# Patient Record
Sex: Female | Born: 1990 | Race: White | Hispanic: No | Marital: Married | Smoking: Never smoker
Health system: Southern US, Community
[De-identification: ages and names within clinical notes are randomized; demographics above are authoritative.]

## PROBLEM LIST (undated history)

## (undated) ENCOUNTER — Inpatient Hospital Stay (HOSPITAL_COMMUNITY): Admission: RE | Payer: Self-pay | Source: Ambulatory Visit

## (undated) DIAGNOSIS — E559 Vitamin D deficiency, unspecified: Secondary | ICD-10-CM

## (undated) DIAGNOSIS — O1410 Severe pre-eclampsia, unspecified trimester: Secondary | ICD-10-CM

## (undated) DIAGNOSIS — F4310 Post-traumatic stress disorder, unspecified: Secondary | ICD-10-CM

## (undated) DIAGNOSIS — T7840XA Allergy, unspecified, initial encounter: Secondary | ICD-10-CM

## (undated) DIAGNOSIS — O24419 Gestational diabetes mellitus in pregnancy, unspecified control: Secondary | ICD-10-CM

## (undated) DIAGNOSIS — Z3490 Encounter for supervision of normal pregnancy, unspecified, unspecified trimester: Secondary | ICD-10-CM

## (undated) DIAGNOSIS — F419 Anxiety disorder, unspecified: Secondary | ICD-10-CM

## (undated) HISTORY — DX: Anxiety disorder, unspecified: F41.9

## (undated) HISTORY — DX: Allergy, unspecified, initial encounter: T78.40XA

## (undated) HISTORY — DX: Post-traumatic stress disorder, unspecified: F43.10

## (undated) HISTORY — DX: Severe pre-eclampsia, unspecified trimester: O14.10

## (undated) HISTORY — PX: EXTERNAL EAR SURGERY: SHX627

## (undated) HISTORY — DX: Gestational diabetes mellitus in pregnancy, unspecified control: O24.419

## (undated) HISTORY — PX: COSMETIC SURGERY: SHX468

## (undated) HISTORY — DX: Encounter for supervision of normal pregnancy, unspecified, unspecified trimester: Z34.90

## (undated) HISTORY — DX: Vitamin D deficiency, unspecified: E55.9

---
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
## 2010-10-12 ENCOUNTER — Encounter: Payer: Self-pay | Admitting: *Deleted

## 2010-10-12 ENCOUNTER — Emergency Department (HOSPITAL_COMMUNITY): Payer: No Typology Code available for payment source

## 2010-10-12 ENCOUNTER — Emergency Department (HOSPITAL_COMMUNITY)
Admission: EM | Admit: 2010-10-12 | Discharge: 2010-10-12 | Disposition: A | Discharge status: Home / Self Care | Payer: No Typology Code available for payment source | Attending: Emergency Medicine | Admitting: Emergency Medicine

## 2010-10-12 ENCOUNTER — Emergency Department (HOSPITAL_COMMUNITY)
Admission: EM | Admit: 2010-10-12 | Discharge: 2010-10-12 | Discharge status: Against Medical Advice | Payer: Self-pay | Attending: *Deleted | Admitting: *Deleted

## 2010-10-12 DIAGNOSIS — Z5309 Procedure and treatment not carried out because of other contraindication: Secondary | ICD-10-CM | POA: Insufficient documentation

## 2010-10-12 DIAGNOSIS — S20219A Contusion of unspecified front wall of thorax, initial encounter: Secondary | ICD-10-CM

## 2010-10-12 DIAGNOSIS — M549 Dorsalgia, unspecified: Secondary | ICD-10-CM

## 2010-10-12 MED ORDER — HYDROCODONE-ACETAMINOPHEN 5-325 MG PO TABS: 1.0000 | ORAL_TABLET | Freq: Four times a day (QID) | ORAL | Status: AC | PRN

## 2010-10-12 NOTE — ED Notes (Signed)
Patient in xray at this time.
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
## 2010-10-12 NOTE — ED Notes (Signed)
Patient removed from LSB per order of Neville Route, PA.  Patient used bedpan while on LSB.

## 2010-10-12 NOTE — ED Notes (Signed)
NT assisted pt with bedpan on arrival . Urine collected for preg test. NAD at this time.

## 2010-10-12 NOTE — ED Notes (Signed)
Pt denies C-spine tenderness or back pain. Pain to right rib area (front and back). NAD. Pt to go for xray. Urine preg negative.
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
## 2010-10-13 LAB — POCT PREGNANCY, URINE
Preg Test, Ur: NEGATIVE
Preg Test, Ur: NEGATIVE

## 2010-10-17 ENCOUNTER — Emergency Department (HOSPITAL_COMMUNITY)
Admission: EM | Admit: 2010-10-17 | Discharge: 2010-10-17 | Discharge status: Against Medical Advice | Payer: No Typology Code available for payment source | Attending: Emergency Medicine | Admitting: Emergency Medicine

## 2010-10-17 ENCOUNTER — Encounter (HOSPITAL_COMMUNITY): Payer: Self-pay | Admitting: *Deleted

## 2010-10-17 DIAGNOSIS — R079 Chest pain, unspecified: Secondary | ICD-10-CM | POA: Insufficient documentation

## 2010-10-17 DIAGNOSIS — Z5320 Procedure and treatment not carried out because of patient's decision for unspecified reasons: Secondary | ICD-10-CM | POA: Insufficient documentation
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
## 2010-10-18 ENCOUNTER — Encounter (HOSPITAL_COMMUNITY): Payer: Self-pay | Admitting: *Deleted

## 2014-02-01 ENCOUNTER — Emergency Department (HOSPITAL_COMMUNITY)
Admission: EM | Admit: 2014-02-01 | Discharge: 2014-02-01 | Disposition: A | Discharge status: Other Acute Inpatient Hospital | Payer: Managed Care, Other (non HMO) | Attending: Emergency Medicine | Admitting: Emergency Medicine

## 2014-02-01 ENCOUNTER — Encounter (HOSPITAL_COMMUNITY): Payer: Self-pay

## 2014-02-01 DIAGNOSIS — Y9389 Activity, other specified: Secondary | ICD-10-CM | POA: Diagnosis not present

## 2014-02-01 DIAGNOSIS — W540XXA Bitten by dog, initial encounter: Secondary | ICD-10-CM | POA: Diagnosis not present

## 2014-02-01 DIAGNOSIS — Z79899 Other long term (current) drug therapy: Secondary | ICD-10-CM | POA: Insufficient documentation

## 2014-02-01 DIAGNOSIS — S01302A Unspecified open wound of left ear, initial encounter: Secondary | ICD-10-CM | POA: Diagnosis present

## 2014-02-01 DIAGNOSIS — Z23 Encounter for immunization: Secondary | ICD-10-CM | POA: Diagnosis not present

## 2014-02-01 DIAGNOSIS — Y929 Unspecified place or not applicable: Secondary | ICD-10-CM | POA: Insufficient documentation

## 2014-02-01 DIAGNOSIS — S0185XA Open bite of other part of head, initial encounter: Secondary | ICD-10-CM

## 2014-02-01 MED ORDER — SODIUM CHLORIDE 0.9 % IV SOLN
3.0000 g | Freq: Four times a day (QID) | INTRAVENOUS | Status: DC
  Administered 2014-02-01: 3 g via INTRAVENOUS
  Filled 2014-02-01: qty 3

## 2014-02-01 MED ORDER — TETANUS-DIPHTH-ACELL PERTUSSIS 5-2.5-18.5 LF-MCG/0.5 IM SUSP
0.5000 mL | Freq: Once | INTRAMUSCULAR | Status: AC
  Administered 2014-02-01: 1 mL via INTRAMUSCULAR
  Filled 2014-02-01: qty 0.5

## 2014-02-01 MED ORDER — MORPHINE SULFATE 4 MG/ML IJ SOLN
4.0000 mg | Freq: Once | INTRAMUSCULAR | Status: AC
  Administered 2014-02-01: 4 mg via INTRAVENOUS
  Filled 2014-02-01: qty 1

## 2014-02-01 NOTE — ED Notes (Signed)
Animal control in room with patient.
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
## 2014-02-01 NOTE — ED Notes (Signed)
Called [facility] line for consult with a facial surgeon.
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
## 2014-02-01 NOTE — ED Notes (Addendum)
Top of ear placed in bio bag with gauze saturated in saline for moisture. Bag then placed in emisis basin with ice. Saline gauze place on ear for moisture to wound. EDP made aware.

## 2014-02-01 NOTE — ED Notes (Signed)
Verbal consent for transfer given for patient to go by POV to [facility]. Unable to obtain signature via esignature.

## 2014-02-01 NOTE — Discharge Instructions (Signed)
Go directly to [facility]. They will call the plastic surgeon when you get there. Do not eat anything or drink anything until you are seen there.

## 2014-02-01 NOTE — ED Notes (Signed)
EDP in room with patient. Husband in waiting room, patient requesting not to see him at this time.

## 2014-02-10 DIAGNOSIS — S01302A Unspecified open wound of left ear, initial encounter: Secondary | ICD-10-CM | POA: Insufficient documentation

## 2014-02-10 DIAGNOSIS — W540XXA Bitten by dog, initial encounter: Secondary | ICD-10-CM | POA: Insufficient documentation

## 2014-03-17 DIAGNOSIS — H61112 Acquired deformity of pinna, left ear: Secondary | ICD-10-CM | POA: Insufficient documentation

## 2014-08-07 DIAGNOSIS — S01302A Unspecified open wound of left ear, initial encounter: Secondary | ICD-10-CM | POA: Insufficient documentation

## 2014-10-26 ENCOUNTER — Telehealth: Payer: Self-pay | Admitting: Family Medicine

## 2014-10-26 NOTE — Telephone Encounter (Signed)
Appointment scheduled.  Patient aware. 

## 2014-11-09 ENCOUNTER — Other Ambulatory Visit: Payer: Self-pay | Admitting: Adult Health

## 2014-11-18 ENCOUNTER — Telehealth: Payer: Self-pay

## 2014-11-18 ENCOUNTER — Ambulatory Visit (INDEPENDENT_AMBULATORY_CARE_PROVIDER_SITE_OTHER): Payer: Managed Care, Other (non HMO) | Admitting: Family Medicine

## 2014-11-18 ENCOUNTER — Encounter: Payer: Self-pay | Admitting: Family Medicine

## 2014-11-18 VITALS — BP 120/81 | HR 100 | Temp 98.6°F | Ht 62.0 in | Wt 130.6 lb

## 2014-11-18 DIAGNOSIS — Z309 Encounter for contraceptive management, unspecified: Secondary | ICD-10-CM

## 2014-11-18 DIAGNOSIS — F4310 Post-traumatic stress disorder, unspecified: Secondary | ICD-10-CM | POA: Insufficient documentation

## 2014-11-18 DIAGNOSIS — Z3009 Encounter for other general counseling and advice on contraception: Secondary | ICD-10-CM

## 2014-11-18 LAB — POCT URINE PREGNANCY: Preg Test, Ur: NEGATIVE

## 2014-11-18 MED ORDER — MEDROXYPROGESTERONE ACETATE 150 MG/ML IM SUSP: 150.0000 mg | INTRAMUSCULAR | Status: DC

## 2014-11-18 MED ORDER — SERTRALINE HCL 50 MG PO TABS: 50.0000 mg | ORAL_TABLET | Freq: Every day | ORAL | Status: DC

## 2014-11-18 NOTE — Assessment & Plan Note (Addendum)
Patient has been on depo-provera and would like to restart, LMP July 4,2016, last intercourse was 11/17/14
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
## 2014-11-18 NOTE — Patient Instructions (Signed)
Posttraumatic Stress Disorder Posttraumatic stress disorder (PTSD) is a mental disorder. It occurs after a traumatic event in your life. The traumatic events that cause PTSD are outside the range of normal human experience. Examples of these events include war, automobile accidents, natural disasters, rape, domestic violence, and violent crimes. Most people who experience these types of events are able to heal on their own. Those who do not heal develop PTSD. PTSD can happen to anyone at any age. However, people with a history of childhood abuse are at increased risk for developing PTSD.  SYMPTOMS  The traumatic event that causes PTSD must be a threat to life, cause serious injury, or involve sexual violence. The traumatic event is usually experienced directly by the person who develops PTSD. Sometimes PTSD occurs in people who witness traumas that occur to others or who hear about a trauma that occurs to a close family member or friend. The following behaviors are characteristic of people with PTSD:  People with PTSD re-experience the traumatic event in one or more of the following ways (intrusion symptoms):  Recurrent, unwanted distressing memories while awake.  Recurrent distressing dreams.  Sensations similar to those felt when the event originally occurred (flashbacks).   Intense or prolonged emotional distress, triggered by reminders of the trauma. This may include fear, horror, intense sadness, or anger.  Marked physical reactions, triggered by reminders of the trauma. This may include racing heart, shortness of breath, sweating, and shaking.  People with PTSD avoid thoughts, conversations, people, or activities that remind them of the traumatic event (avoidance symptoms).  People with PTSD have negative changes in their thinking and mood after the traumatic event. These changes include:  Inability to remember one or more significant aspects of the traumatic event (memory  gaps).  Exaggerated negative perceptions about themselves or others, such as believing that they are bad people or that no one can be trusted.  Unrealistic assignment of blame to themselves or others for the traumatic event.  Persistent negative emotional state, such as fear, horror, anger, sadness, guilt, or shame.  Markedly decreased interest or participation in significant activities.  A loss of connection with other people.  Inability to experience positive emotions, such as happiness or love.  People with PTSD are more sensitive to their environment and react more easily than others (hyperarousal-overreactivity symptoms). These symptoms include:  Irritability, with angry outbursts toward other people or objects. The outbursts are easily triggered and may be verbal or physical.  Careless or self-destructive behavior. This may include reckless driving or drug use.  A feeling of being on edge, with increased alertness (hypervigilance).  Exaggerated reactions to stimuli, such as being easily startled.   Difficulty concentrating.  Difficulty sleeping. PTSD symptoms may start soon after a frightening event or months or years later. They last at least 1 month or longer and can affect one or more areas of functioning, such as social or occupational functioning.  DIAGNOSIS  PTSD is diagnosed through an assessment by a mental health professional. You will be asked questions about the traumatic events in your life. You will also be asked about how these events have changed your thoughts, mood, behavior, and ability to function on a daily basis. You may be asked about your use of alcohol or drugs, which can make PTSD symptoms worse. TREATMENT  Unlike many mental disorders, which require lifelong management, PTSD is a curable condition. The goal of PTSD treatment is to neutralize the negative effects of the traumatic event on daily   functioning, not erase the memory of the event. The  following treatments may be prescribed to reach this goal:  Medicines. Certain medicines can reduce some PTSD symptoms. Intrusion symptoms and hyperarousal-overactivity symptoms respond best to medicines.  Counseling (talk therapy). Talk therapy with a mental health professional who is experienced in treating PTSD can help. Talk therapy can provide education, emotional support, and coping skills. Certain types of talk therapy that specifically target the traumatic events are the most effective treatment for PTSD:  Prolonged exposure therapy, which involves remembering and processing the traumatic event with a therapist in a safe environment until it no longer creates a negative emotional response.  Eye movement desensitization and reprocessing therapy, which involves the use of repetitive physical stimulation of the senses that alternates between the right and left sides of the body. It is believed that this therapy facilitates communication between the two sides of the brain. This communication helps the mind to integrate the fragmented memories of the traumatic event into a whole story that makes sense and no longer creates a negative emotional response. Most people with PTSD benefit from a combination of these treatments.  Document Released: 12/13/2000 Document Revised: 08/04/2013 Document Reviewed: 06/06/2012 Exit[employer] Patient Information 2015 [employer]. This information is not intended to replace advice given to you by your health care provider. Make sure you discuss any questions you have with your health care provider.  

## 2014-11-18 NOTE — Telephone Encounter (Signed)
Contacted patient, left voicemail to call us back.  Patient is to come back in two weeks for second urine pregnancy test, she just needs to call us the morning that she is planning to come so that we  Can put her on the triage nurses schedule.  Dr. Louanne Skye will order her Depo Provera at that time and patient can pick up medication and come back for administration.

## 2014-11-18 NOTE — Assessment & Plan Note (Signed)
PTSD from dog attack that occurred 3-4 months ago. Discussed the necessity of seeing a counselor and gave a list of local counselors so that she could call make an appointment. We'll also start Zoloft at 50 mg. We'll see back in 4 weeks. Denies suicidal ideation. Discussed risks of medication.

## 2014-11-19 NOTE — Telephone Encounter (Signed)
Patient returned call,patient scheduled appointment with triage nurse for recheck of urine pregnancy and depo provera shot.

## 2014-12-01 ENCOUNTER — Ambulatory Visit: Payer: Self-pay

## 2014-12-02 ENCOUNTER — Ambulatory Visit: Payer: Self-pay

## 2014-12-11 ENCOUNTER — Encounter: Payer: Self-pay | Admitting: Family Medicine

## 2014-12-11 ENCOUNTER — Ambulatory Visit (INDEPENDENT_AMBULATORY_CARE_PROVIDER_SITE_OTHER): Payer: Managed Care, Other (non HMO) | Admitting: Family Medicine

## 2014-12-11 VITALS — BP 102/70 | HR 85 | Temp 98.7°F | Ht 62.0 in | Wt 130.6 lb

## 2014-12-11 DIAGNOSIS — Z30011 Encounter for initial prescription of contraceptive pills: Secondary | ICD-10-CM | POA: Diagnosis not present

## 2014-12-11 DIAGNOSIS — F4310 Post-traumatic stress disorder, unspecified: Secondary | ICD-10-CM | POA: Diagnosis not present

## 2014-12-11 LAB — POCT URINE PREGNANCY: Preg Test, Ur: NEGATIVE

## 2014-12-11 MED ORDER — NORGESTIMATE-ETH ESTRADIOL 0.25-35 MG-MCG PO TABS: 1.0000 | ORAL_TABLET | Freq: Every day | ORAL | Status: DC

## 2014-12-11 NOTE — Patient Instructions (Signed)

## 2014-12-11 NOTE — Assessment & Plan Note (Signed)
Patient tried Zoloft and did not like the way that made her feel such as grogginess and just feeling out of her self. She has been numbers for the counselors but has not called anybody yet, recommended to do that. Talked about other different medications that we could try and she did not want to try any once yet but will come back in a month or sooner if has any further issues.

## 2014-12-11 NOTE — Progress Notes (Signed)
BP 102/70 mmHg  Pulse 85  Temp(Src) 98.7 F (37.1 C) (Oral)  Ht 5\' 2"  (1.575 m)  Wt 130 lb 9.6 oz (59.24 kg)  BMI 23.88 kg/m2  LMP 10/04/2014 (Approximate)   Subjective:    Patient ID: Kristi Anderson, female    DOB: Jul 16, 1990, 24 y.o.   MRN: [id number]  HPI: Kristi Anderson is a 24 y.o. female presenting on 12/11/2014 for Follow up/Discuss Medications   HPI Anxiety/stress disorder Patient presents today for recheck on her anxiety/stress/posttraumatic stress disorder. She was on Zoloft for the past 3 weeks does not like to grogginess in the way that it makes her feel. She also still had an anxiety attack and did not like that it did not help her completely with that. She has not gone to see what the counselors yet but plans to call them for it. She denies any suicidal ideations. She would like to not go on a medication right now and see how she does with the counseling.  Birth control Patient would like to start birth control today, we discussed doing that the shots which she has had previously but she has opted not to do that. She also has had birth control oral pills and would like to do that instead. She's not had a period for 2 months so we'll run a urine pregnancy test.  Relevant past medical, surgical, family and social history reviewed and updated as indicated. Interim medical history since our last visit reviewed. Allergies and medications reviewed and updated.  Review of Systems  Constitutional: Negative for fever and chills.  HENT: Negative for congestion, ear discharge and ear pain.   Eyes: Negative for redness and visual disturbance.  Respiratory: Negative for chest tightness and shortness of breath.   Cardiovascular: Negative for chest pain and leg swelling.  Genitourinary: Positive for menstrual problem (no period for 2 months). Negative for dysuria, urgency, decreased urine volume, vaginal bleeding, vaginal discharge, difficulty urinating, vaginal pain and pelvic  pain.  Musculoskeletal: Negative for back pain and gait problem.  Skin: Negative for rash.  Neurological: Negative for dizziness, light-headedness and headaches.  Psychiatric/Behavioral: Positive for dysphoric mood and agitation. Negative for suicidal ideas, behavioral problems, sleep disturbance and decreased concentration. The patient is nervous/anxious.   All other systems reviewed and are negative.   Per HPI unless specifically indicated above     Medication List       This list is accurate as of: 12/11/14 12:08 PM.  Always use your most recent med list.               norgestimate-ethinyl estradiol 0.25-35 MG-MCG tablet  Commonly known as:  ORTHO-CYCLEN (28)  Take 1 tablet by mouth daily.           Objective:    BP 102/70 mmHg  Pulse 85  Temp(Src) 98.7 F (37.1 C) (Oral)  Ht 5\' 2"  (1.575 m)  Wt 130 lb 9.6 oz (59.24 kg)  BMI 23.88 kg/m2  LMP 10/04/2014 (Approximate)  Wt Readings from Last 3 Encounters:  12/11/14 130 lb 9.6 oz (59.24 kg)  11/18/14 130 lb 9.6 oz (59.24 kg)  02/01/14 115 lb (52.164 kg)    Physical Exam  Constitutional: She is oriented to person, place, and time. She appears well-developed and well-nourished. No distress.  Eyes: Conjunctivae and EOM are normal.  Cardiovascular: Normal rate, regular rhythm, normal heart sounds and intact distal pulses.   No murmur heard. Pulmonary/Chest: Effort normal and breath sounds normal. No respiratory distress. She  has no wheezes.  Musculoskeletal: Normal range of motion. She exhibits no edema or tenderness.  Neurological: She is alert and oriented to person, place, and time. Coordination normal.  Skin: Skin is warm and dry. No rash noted. She is not diaphoretic.  Psychiatric: She has a normal mood and affect. Her behavior is normal.  Vitals reviewed.   Results for orders placed or performed in visit on 11/18/14  POCT urine pregnancy  Result Value Ref Range   Preg Test, Ur Negative Negative        Assessment & Plan:   Problem List Items Addressed This Visit      Other   Post traumatic stress disorder    Patient tried Zoloft and did not like the way that made her feel such as grogginess and just feeling out of her self. She has been numbers for the counselors but has not called anybody yet, recommended to do that. Talked about other different medications that we could try and she did not want to try any once yet but will come back in a month or sooner if has any further issues.       Other Visit Diagnoses    Encounter for initial prescription of contraceptive pills    -  Primary    Patient wants to discuss oral contraceptives instead of Depo shots. Sent Ortho-Cyclen. No period For 2 months, urine pregnancy was negative    Relevant Medications    norgestimate-ethinyl estradiol (ORTHO-CYCLEN, 28,) 0.25-35 MG-MCG tablet    Other Relevant Orders    POCT urine pregnancy (Completed)       She did not want to do her pelvic exam and Pap today but will come back for that in 4 weeks.  Follow up plan: Return in about 4 weeks (around 01/08/2015), or if symptoms worsen or fail to improve, for well woman and PAP.  Arville Care, MD [facility] 12/11/2014, 12:08 PM

## 2015-01-14 ENCOUNTER — Other Ambulatory Visit: Payer: Self-pay | Admitting: Family Medicine

## 2015-01-27 ENCOUNTER — Encounter: Payer: Managed Care, Other (non HMO) | Admitting: Family

## 2015-02-15 ENCOUNTER — Encounter: Payer: Managed Care, Other (non HMO) | Admitting: Family

## 2015-03-10 ENCOUNTER — Encounter: Payer: Self-pay | Admitting: Family Medicine

## 2015-03-10 ENCOUNTER — Ambulatory Visit (INDEPENDENT_AMBULATORY_CARE_PROVIDER_SITE_OTHER): Payer: Managed Care, Other (non HMO) | Admitting: Family Medicine

## 2015-03-10 VITALS — BP 110/78 | HR 101 | Temp 99.4°F | Ht 62.0 in | Wt 132.0 lb

## 2015-03-10 DIAGNOSIS — F32A Depression, unspecified: Secondary | ICD-10-CM

## 2015-03-10 DIAGNOSIS — R0981 Nasal congestion: Secondary | ICD-10-CM | POA: Diagnosis not present

## 2015-03-10 DIAGNOSIS — F418 Other specified anxiety disorders: Secondary | ICD-10-CM

## 2015-03-10 DIAGNOSIS — F419 Anxiety disorder, unspecified: Secondary | ICD-10-CM

## 2015-03-10 DIAGNOSIS — F4310 Post-traumatic stress disorder, unspecified: Secondary | ICD-10-CM

## 2015-03-10 DIAGNOSIS — F329 Major depressive disorder, single episode, unspecified: Secondary | ICD-10-CM

## 2015-03-10 MED ORDER — FLUTICASONE PROPIONATE 50 MCG/ACT NA SUSP: 1.0000 | Freq: Two times a day (BID) | NASAL | Status: DC | PRN

## 2015-03-10 MED ORDER — SERTRALINE HCL 50 MG PO TABS: 50.0000 mg | ORAL_TABLET | Freq: Every day | ORAL | Status: DC

## 2015-03-10 NOTE — Progress Notes (Signed)
BP 110/78 mmHg  Pulse 101  Temp(Src) 99.4 F (37.4 C) (Oral)  Ht 5\' 2"  (1.575 m)  Wt 132 lb (59.875 kg)  BMI 24.14 kg/m2  LMP 02/27/2015 (Exact Date)   Subjective:    Patient ID: Kristi Anderson, female    DOB: [birth year], 24 y.o.   MRN: [id number]  HPI: Kristi Anderson is a 24 y.o. female presenting on 03/10/2015 for Discuss Sertraline   HPI Anxiety and depression and posterior traumatic stress disorder Patient presents for recheck on her anxiety and depression post right stress disorder. She has been taking 25 mg of Zoloft. She lowered herself down to a half pill of the Zoloft because she felt like she was getting some agitation and tremors from the medication. She feels like she is still having some anxiety episodes and some depression. She has been sleeping better at night but feels like the medication is not completely therefore her. She would like to try to go back up to the 50 mg and see if it goes better for her. She denies any suicidal ideations or thoughts of hurting herself.  Nasal congestion and sinus pressure Patient is also been having some nasal congestion and sinus pressure that is been going on for about a week. She denies any fevers or chills. The nasal drainage is clear to white. She has sinus pressure that is worse under her left eye. She denies any sick contacts. She does sometimes get allergies this time year. She has not really been using anything for this yet.  Relevant past medical, surgical, family and social history reviewed and updated as indicated. Interim medical history since our last visit reviewed. Allergies and medications reviewed and updated.  Review of Systems  Constitutional: Negative for fever and chills.  HENT: Positive for congestion, postnasal drip, sinus pressure and sore throat. Negative for ear discharge, ear pain, rhinorrhea and sneezing.   Eyes: Negative for pain, redness and visual disturbance.  Respiratory: Positive for cough.  Negative for chest tightness and shortness of breath.   Cardiovascular: Negative for chest pain and leg swelling.  Genitourinary: Negative for dysuria and difficulty urinating.  Musculoskeletal: Negative for back pain and gait problem.  Skin: Negative for rash.  Neurological: Negative for light-headedness and headaches.  Psychiatric/Behavioral: Positive for dysphoric mood and decreased concentration. Negative for suicidal ideas, behavioral problems, sleep disturbance, self-injury and agitation. The patient is nervous/anxious.   All other systems reviewed and are negative.   Per HPI unless specifically indicated above     Medication List       This list is accurate as of: 03/10/15  1:02 PM.  Always use your most recent med list.               fluticasone 50 MCG/ACT nasal spray  Commonly known as:  FLONASE  Place 1 spray into both nostrils 2 (two) times daily as needed for allergies or rhinitis.     norgestimate-ethinyl estradiol 0.25-35 MG-MCG tablet  Commonly known as:  ORTHO-CYCLEN (28)  Take 1 tablet by mouth daily.     sertraline 50 MG tablet  Commonly known as:  ZOLOFT  Take 1 tablet (50 mg total) by mouth daily.           Objective:    BP 110/78 mmHg  Pulse 101  Temp(Src) 99.4 F (37.4 C) (Oral)  Ht 5\' 2"  (1.575 m)  Wt 132 lb (59.875 kg)  BMI 24.14 kg/m2  LMP 02/27/2015 (Exact Date)  Wt Readings from Last 3  Encounters:  03/10/15 132 lb (59.875 kg)  12/11/14 130 lb 9.6 oz (59.24 kg)  11/18/14 130 lb 9.6 oz (59.24 kg)    Physical Exam  Constitutional: She is oriented to person, place, and time. She appears well-developed and well-nourished. No distress.  HENT:  Right Ear: Tympanic membrane, external ear and ear canal normal.  Left Ear: Tympanic membrane, external ear and ear canal normal.  Nose: Mucosal edema and rhinorrhea present. No epistaxis. Right sinus exhibits no maxillary sinus tenderness and no frontal sinus tenderness. Left sinus exhibits  maxillary sinus tenderness. Left sinus exhibits no frontal sinus tenderness.  Mouth/Throat: Uvula is midline and mucous membranes are normal. Posterior oropharyngeal edema and posterior oropharyngeal erythema present. No oropharyngeal exudate or tonsillar abscesses.  Eyes: Conjunctivae and EOM are normal.  Neck: Neck supple. No thyromegaly present.  Cardiovascular: Normal rate, regular rhythm, normal heart sounds and intact distal pulses.   No murmur heard. Pulmonary/Chest: Effort normal and breath sounds normal. No respiratory distress. She has no wheezes.  Musculoskeletal: Normal range of motion. She exhibits no edema or tenderness.  Lymphadenopathy:    She has no cervical adenopathy.  Neurological: She is alert and oriented to person, place, and time. Coordination normal.  Skin: Skin is warm and dry. No rash noted. She is not diaphoretic.  Psychiatric: Her behavior is normal. Judgment and thought content normal. Her mood appears anxious. Her affect is labile. Her affect is not blunt and not inappropriate. She exhibits a depressed mood. She expresses no suicidal ideation. She expresses no suicidal plans.  Vitals reviewed.   Results for orders placed or performed in visit on 12/11/14  POCT urine pregnancy  Result Value Ref Range   Preg Test, Ur Negative Negative      Assessment & Plan:   Problem List Items Addressed This Visit      Other   Post traumatic stress disorder - Primary   Relevant Medications   sertraline (ZOLOFT) 50 MG tablet    Other Visit Diagnoses    Anxiety and depression        Increased to full tablet of Zoloft, 50 mg    Relevant Medications    sertraline (ZOLOFT) 50 MG tablet    Nasal sinus congestion        Use Flonase and antihistamine    Relevant Medications    fluticasone (FLONASE) 50 MCG/ACT nasal spray        Follow up plan: Return in about 4 weeks (around 04/07/2015), or if symptoms worsen or fail to improve, for Well woman exam/Pap.  Counseling  provided for all of the vaccine components No orders of the defined types were placed in this encounter.    Arville Care, MD [facility] 03/10/2015, 1:02 PM

## 2015-04-04 NOTE — L&D Delivery Note (Signed)
Delivery Note At 4:15 PM a viable female was delivered via Vaginal, Spontaneous Delivery (Presentation: OA;  ).  APGAR: 6, 8; weight pending .   Placenta status: delivered intact with shultz presentation after gentle traction; 3 VC.   Anesthesia:  epidural Episiotomy: None Lacerations: 1st degree periurethral and right labial repaired with 3-0 vicryl Est. Blood Loss (mL): 250  Mom to postpartum.  Baby to Couplet care / Skin to Skin.  Charlesetta GaribaldiKathryn Lorraine Kooistra CNM 01/11/2016, 5:08 PM  OB FELLOW DELIVERY ATTESTATION  I was gloved and present for the delivery in its entirety, and I agree with the above resident's note.    Ernestina PennaNicholas Schenk, MD 6:57 PM

## 2015-04-07 ENCOUNTER — Ambulatory Visit: Payer: Managed Care, Other (non HMO) | Admitting: Family Medicine

## 2015-04-08 ENCOUNTER — Ambulatory Visit: Payer: Managed Care, Other (non HMO) | Admitting: Family Medicine

## 2015-04-28 ENCOUNTER — Other Ambulatory Visit: Payer: Managed Care, Other (non HMO) | Admitting: Family Medicine

## 2015-04-29 ENCOUNTER — Ambulatory Visit (INDEPENDENT_AMBULATORY_CARE_PROVIDER_SITE_OTHER): Payer: 59 | Admitting: Family

## 2015-04-29 ENCOUNTER — Encounter: Payer: Self-pay | Admitting: Family

## 2015-04-29 ENCOUNTER — Other Ambulatory Visit: Payer: Managed Care, Other (non HMO) | Admitting: Family

## 2015-04-29 VITALS — BP 118/74 | HR 96 | Temp 96.8°F | Ht 62.0 in | Wt 132.6 lb

## 2015-04-29 DIAGNOSIS — Z01419 Encounter for gynecological examination (general) (routine) without abnormal findings: Secondary | ICD-10-CM

## 2015-04-29 DIAGNOSIS — Z0000 Encounter for general adult medical examination without abnormal findings: Secondary | ICD-10-CM | POA: Diagnosis not present

## 2015-04-29 DIAGNOSIS — F4310 Post-traumatic stress disorder, unspecified: Secondary | ICD-10-CM | POA: Diagnosis not present

## 2015-04-29 LAB — POCT URINALYSIS DIPSTICK
Bilirubin, UA: NEGATIVE
Glucose, UA: NEGATIVE
Ketones, UA: NEGATIVE
Nitrite, UA: NEGATIVE
Protein, UA: NEGATIVE
Spec Grav, UA: <=1.005
Urobilinogen, UA: NEGATIVE
pH, UA: 5

## 2015-04-29 LAB — POCT UA - MICROSCOPIC ONLY
Casts, Ur, LPF, POC: NEGATIVE
Crystals, Ur, HPF, POC: NEGATIVE
Mucus, UA: NEGATIVE
Yeast, UA: NEGATIVE

## 2015-04-29 NOTE — Patient Instructions (Signed)
Health Maintenance, Female Adopting a healthy lifestyle and getting preventive care can go a long way to promote health and wellness. Talk with your health care provider about what schedule of regular examinations is right for you. This is a good chance for you to check in with your provider about disease prevention and staying healthy. In between checkups, there are plenty of things you can do on your own. Experts have done a lot of research about which lifestyle changes and preventive measures are most likely to keep you healthy. Ask your health care provider for more information. WEIGHT AND DIET  Eat a healthy diet  Be sure to include plenty of vegetables, fruits, low-fat dairy products, and lean protein.  Do not eat a lot of foods high in solid fats, added sugars, or salt.  Get regular exercise. This is one of the most important things you can do for your health.  Most adults should exercise for at least 150 minutes each week. The exercise should increase your heart rate and make you sweat (moderate-intensity exercise).  Most adults should also do strengthening exercises at least twice a week. This is in addition to the moderate-intensity exercise.  Maintain a healthy weight  Body mass index (BMI) is a measurement that can be used to identify possible weight problems. It estimates body fat based on height and weight. Your health care provider can help determine your BMI and help you achieve or maintain a healthy weight.  For females 20 years of age and older:   A BMI below 18.5 is considered underweight.  A BMI of 18.5 to 24.9 is normal.  A BMI of 25 to 29.9 is considered overweight.  A BMI of 30 and above is considered obese.  Watch levels of cholesterol and blood lipids  You should start having your blood tested for lipids and cholesterol at 27 years of age, then have this test every 5 years.  You may need to have your cholesterol levels checked more often if:  Your lipid  or cholesterol levels are high.  You are older than 27 years of age.  You are at high risk for heart disease.  CANCER SCREENING   Lung Cancer  Lung cancer screening is recommended for adults 55-80 years old who are at high risk for lung cancer because of a history of smoking.  A yearly low-dose CT scan of the lungs is recommended for people who:  Currently smoke.  Have quit within the past 15 years.  Have at least a 30-pack-year history of smoking. A pack year is smoking an average of one pack of cigarettes a day for 1 year.  Yearly screening should continue until it has been 15 years since you quit.  Yearly screening should stop if you develop a health problem that would prevent you from having lung cancer treatment.  Breast Cancer  Practice breast self-awareness. This means understanding how your breasts normally appear and feel.  It also means doing regular breast self-exams. Let your health care provider know about any changes, no matter how small.  If you are in your 20s or 30s, you should have a clinical breast exam (CBE) by a health care provider every 1-3 years as part of a regular health exam.  If you are 40 or older, have a CBE every year. Also consider having a breast X-ray (mammogram) every year.  If you have a family history of breast cancer, talk to your health care provider about genetic screening.  If you   are at high risk for breast cancer, talk to your health care provider about having an MRI and a mammogram every year.  Breast cancer gene (BRCA) assessment is recommended for women who have family members with BRCA-related cancers. BRCA-related cancers include:  Breast.  Ovarian.  Tubal.  Peritoneal cancers.  Results of the assessment will determine the need for genetic counseling and BRCA1 and BRCA2 testing. Cervical Cancer Your health care provider may recommend that you be screened regularly for cancer of the pelvic organs (ovaries, uterus, and  vagina). This screening involves a pelvic examination, including checking for microscopic changes to the surface of your cervix (Pap test). You may be encouraged to have this screening done every 3 years, beginning at age 21.  For women ages 30-65, health care providers may recommend pelvic exams and Pap testing every 3 years, or they may recommend the Pap and pelvic exam, combined with testing for human papilloma virus (HPV), every 5 years. Some types of HPV increase your risk of cervical cancer. Testing for HPV may also be done on women of any age with unclear Pap test results.  Other health care providers may not recommend any screening for nonpregnant women who are considered low risk for pelvic cancer and who do not have symptoms. Ask your health care provider if a screening pelvic exam is right for you.  If you have had past treatment for cervical cancer or a condition that could lead to cancer, you need Pap tests and screening for cancer for at least 20 years after your treatment. If Pap tests have been discontinued, your risk factors (such as having a new sexual partner) need to be reassessed to determine if screening should resume. Some women have medical problems that increase the chance of getting cervical cancer. In these cases, your health care provider may recommend more frequent screening and Pap tests. Colorectal Cancer  This type of cancer can be detected and often prevented.  Routine colorectal cancer screening usually begins at 27 years of age and continues through 27 years of age.  Your health care provider may recommend screening at an earlier age if you have risk factors for colon cancer.  Your health care provider may also recommend using home test kits to check for hidden blood in the stool.  A small camera at the end of a tube can be used to examine your colon directly (sigmoidoscopy or colonoscopy). This is done to check for the earliest forms of colorectal  cancer.  Routine screening usually begins at age 50.  Direct examination of the colon should be repeated every 5-10 years through 27 years of age. However, you may need to be screened more often if early forms of precancerous polyps or small growths are found. Skin Cancer  Check your skin from head to toe regularly.  Tell your health care provider about any new moles or changes in moles, especially if there is a change in a mole's shape or color.  Also tell your health care provider if you have a mole that is larger than the size of a pencil eraser.  Always use sunscreen. Apply sunscreen liberally and repeatedly throughout the day.  Protect yourself by wearing long sleeves, pants, a wide-brimmed hat, and sunglasses whenever you are outside. HEART DISEASE, DIABETES, AND HIGH BLOOD PRESSURE   High blood pressure causes heart disease and increases the risk of stroke. High blood pressure is more likely to develop in:  People who have blood pressure in the high end   of the normal range (130-139/85-89 mm Hg).  People who are overweight or obese.  People who are African American.  If you are 38-23 years of age, have your blood pressure checked every 3-5 years. If you are 61 years of age or older, have your blood pressure checked every year. You should have your blood pressure measured twice--once when you are at a hospital or clinic, and once when you are not at a hospital or clinic. Record the average of the two measurements. To check your blood pressure when you are not at a hospital or clinic, you can use:  An automated blood pressure machine at a pharmacy.  A home blood pressure monitor.  If you are between 45 years and 39 years old, ask your health care provider if you should take aspirin to prevent strokes.  Have regular diabetes screenings. This involves taking a blood sample to check your fasting blood sugar level.  If you are at a normal weight and have a low risk for diabetes,  have this test once every three years after 27 years of age.  If you are overweight and have a high risk for diabetes, consider being tested at a younger age or more often. PREVENTING INFECTION  Hepatitis B  If you have a higher risk for hepatitis B, you should be screened for this virus. You are considered at high risk for hepatitis B if:  You were born in a country where hepatitis B is common. Ask your health care provider which countries are considered high risk.  Your parents were born in a high-risk country, and you have not been immunized against hepatitis B (hepatitis B vaccine).  You have HIV or AIDS.  You use needles to inject street drugs.  You live with someone who has hepatitis B.  You have had sex with someone who has hepatitis B.  You get hemodialysis treatment.  You take certain medicines for conditions, including cancer, organ transplantation, and autoimmune conditions. Hepatitis C  Blood testing is recommended for:  Everyone born from [birth year] through 1967.  Anyone with known risk factors for hepatitis C. Sexually transmitted infections (STIs)  You should be screened for sexually transmitted infections (STIs) including gonorrhea and chlamydia if:  You are sexually active and are younger than 27 years of age.  You are older than 27 years of age and your health care provider tells you that you are at risk for this type of infection.  Your sexual activity has changed since you were last screened and you are at an increased risk for chlamydia or gonorrhea. Ask your health care provider if you are at risk.  If you do not have HIV, but are at risk, it may be recommended that you take a prescription medicine daily to prevent HIV infection. This is called pre-exposure prophylaxis (PrEP). You are considered at risk if:  You are sexually active and do not regularly use condoms or know the HIV status of your partner(s).  You take drugs by injection.  You are sexually  active with a partner who has HIV. Talk with your health care provider about whether you are at high risk of being infected with HIV. If you choose to begin PrEP, you should first be tested for HIV. You should then be tested every 3 months for as long as you are taking PrEP.  PREGNANCY   If you are premenopausal and you may become pregnant, ask your health care provider about preconception counseling.  If you may  become pregnant, take 400 to 800 micrograms (mcg) of folic acid every day.  If you want to prevent pregnancy, talk to your health care provider about birth control (contraception). OSTEOPOROSIS AND MENOPAUSE   Osteoporosis is a disease in which the bones lose minerals and strength with aging. This can result in serious bone fractures. Your risk for osteoporosis can be identified using a bone density scan.  If you are 61 years of age or older, or if you are at risk for osteoporosis and fractures, ask your health care provider if you should be screened.  Ask your health care provider whether you should take a calcium or vitamin D supplement to lower your risk for osteoporosis.  Menopause may have certain physical symptoms and risks.  Hormone replacement therapy may reduce some of these symptoms and risks. Talk to your health care provider about whether hormone replacement therapy is right for you.  HOME CARE INSTRUCTIONS   Schedule regular health, dental, and eye exams.  Stay current with your immunizations.   Do not use any tobacco products including cigarettes, chewing tobacco, or electronic cigarettes.  If you are pregnant, do not drink alcohol.  If you are breastfeeding, limit how much and how often you drink alcohol.  Limit alcohol intake to no more than 1 drink per day for nonpregnant women. One drink equals 12 ounces of beer, 5 ounces of wine, or 1 ounces of hard liquor.  Do not use street drugs.  Do not share needles.  Ask your health care provider for help if  you need support or information about quitting drugs.  Tell your health care provider if you often feel depressed.  Tell your health care provider if you have ever been abused or do not feel safe at home.   This information is not intended to replace advice given to you by your health care provider. Make sure you discuss any questions you have with your health care provider.   Document Released: 10/03/2010 Document Revised: 04/10/2014 Document Reviewed: 02/19/2013 Elsevier Interactive Patient Education [employer].

## 2015-04-29 NOTE — Addendum Note (Signed)
Addended by: Prescott Gum on: 04/29/2015 12:50 PM   Modules accepted: Kipp Brood

## 2015-04-29 NOTE — Progress Notes (Signed)
Subjective:    Patient ID: Kristi Anderson, female    DOB: 02-15-1991, 25 y.o.   MRN: [id number]  Pt presents to the office today for CPE with pap.  Gynecologic Exam Pertinent negatives include no chills, headaches or sore throat.  Cough This is a new problem. The current episode started 1 to 4 weeks ago. The problem has been waxing and waning. The problem occurs every few minutes. The cough is non-productive. Associated symptoms include nasal congestion. Pertinent negatives include no chills, ear congestion, ear pain, headaches, myalgias, rhinorrhea, sore throat, shortness of breath or wheezing. The symptoms are aggravated by lying down. She has tried rest and OTC cough suppressant (Flonase) for the symptoms. The treatment provided mild relief. There is no history of asthma or COPD.  Post traumatic stress disorder/ GAD PT states she is taking her Zoloft 50 mg every day. PT states this seems to be helping her anxiety and panic attacks.     Review of Systems  Constitutional: Negative.  Negative for chills.  HENT: Negative.  Negative for ear pain, rhinorrhea and sore throat.   Eyes: Negative.   Respiratory: Positive for cough. Negative for shortness of breath and wheezing.   Cardiovascular: Negative.  Negative for palpitations.  Gastrointestinal: Negative.   Endocrine: Negative.   Genitourinary: Negative.   Musculoskeletal: Negative.  Negative for myalgias.  Neurological: Negative.  Negative for headaches.  Hematological: Negative.   Psychiatric/Behavioral: Negative.   All other systems reviewed and are negative.      Objective:   Physical Exam  Constitutional: She is oriented to person, place, and time. She appears well-developed and well-nourished. No distress.  HENT:  Head: Normocephalic and atraumatic.  Right Ear: External ear normal.  Left Ear: External ear normal.  Nose: Nose normal.  Mouth/Throat: Oropharynx is clear and moist.  Eyes: Pupils are equal, round, and  reactive to light.  Neck: Normal range of motion. Neck supple. No thyromegaly present.  Cardiovascular: Normal rate, regular rhythm, normal heart sounds and intact distal pulses.   No murmur heard. Pulmonary/Chest: Effort normal and breath sounds normal. No respiratory distress. She has no wheezes. Right breast exhibits no inverted nipple, no mass, no nipple discharge, no skin change and no tenderness. Left breast exhibits no inverted nipple, no mass, no nipple discharge, no skin change and no tenderness. Breasts are symmetrical.  Abdominal: Soft. Bowel sounds are normal. She exhibits no distension. There is no tenderness.  Genitourinary: Vagina normal. No vaginal discharge found.  Bimanual exam- no adnexal masses or tenderness, ovaries nonpalpable   Cervix parous and pink- No discharge   Musculoskeletal: Normal range of motion. She exhibits no edema or tenderness.  Neurological: She is alert and oriented to person, place, and time. She has normal reflexes. No cranial nerve deficit.  Skin: Skin is warm and dry.  Psychiatric: She has a normal mood and affect. Her behavior is normal. Judgment and thought content normal.      BP 118/74 mmHg  Pulse 96  Temp(Src) 96.8 F (36 C) (Oral)  Ht _0  (1.575 m)  Wt 132 lb 9.6 oz (60.147 kg)  BMI 24.25 kg/m2  LMP 04/22/2015     Assessment & Plan:  1. Encounter for routine gynecological examination - POCT UA - Microscopic Only - POCT urinalysis dipstick - Pap IG, CT/NG w/ reflex HPV when ASC-U - CMP14+EGFR  2. Post traumatic stress disorder - CMP14+EGFR  3. Annual physical exam - Anemia Profile B - CMP14+EGFR - Lipid panel - Thyroid  Panel With TSH - VITAMIN D 25 Hydroxy (Vit-D Deficiency, Fractures)   Continue all meds Labs pending Health Maintenance reviewed Diet and exercise encouraged RTO 1 year and keep follow up appts with Dr. Warrick Parisian   Evelina Dun, FNP

## 2015-04-30 LAB — VITAMIN D 25 HYDROXY (VIT D DEFICIENCY, FRACTURES): Vit D, 25-Hydroxy: 14.9 ng/mL — ABNORMAL LOW (ref 30.0–100.0)

## 2015-04-30 LAB — ANEMIA PROFILE B
Basophils Absolute: 0.1 10*3/uL (ref 0.0–0.2)
Basos: 1 %
EOS (ABSOLUTE): 0.1 10*3/uL (ref 0.0–0.4)
Eos: 1 %
Ferritin: 46 ng/mL (ref 15–150)
Folate: 10.6 ng/mL (ref >3.0)
Hematocrit: 42.3 % (ref 34.0–46.6)
Hemoglobin: 14.3 g/dL (ref 11.1–15.9)
Immature Grans (Abs): 0 10*3/uL (ref 0.0–0.1)
Immature Granulocytes: 0 %
Iron Saturation: 34 % (ref 15–55)
Iron: 98 ug/dL (ref 27–159)
Lymphocytes Absolute: 2.1 10*3/uL (ref 0.7–3.1)
Lymphs: 36 %
MCH: 30.3 pg (ref 26.6–33.0)
MCHC: 33.8 g/dL (ref 31.5–35.7)
MCV: 90 fL (ref 79–97)
Monocytes Absolute: 0.5 10*3/uL (ref 0.1–0.9)
Monocytes: 8 %
Neutrophils Absolute: 3.1 10*3/uL (ref 1.4–7.0)
Neutrophils: 54 %
Platelets: 294 10*3/uL (ref 150–379)
RBC: 4.72 x10E6/uL (ref 3.77–5.28)
RDW: 12.7 % (ref 12.3–15.4)
Retic Ct Pct: 0.8 % (ref 0.6–2.6)
Total Iron Binding Capacity: 292 ug/dL (ref 250–450)
UIBC: 194 ug/dL (ref 131–425)
Vitamin B-12: 381 pg/mL (ref 211–946)
WBC: 5.7 10*3/uL (ref 3.4–10.8)

## 2015-04-30 LAB — LIPID PANEL
Chol/HDL Ratio: 3.5 ratio units (ref 0.0–4.4)
Cholesterol, Total: 173 mg/dL (ref 100–199)
HDL: 50 mg/dL (ref >39)
LDL Calculated: 109 mg/dL — ABNORMAL HIGH (ref 0–99)
Triglycerides: 70 mg/dL (ref 0–149)
VLDL Cholesterol Cal: 14 mg/dL (ref 5–40)

## 2015-04-30 LAB — CMP14+EGFR
ALT: 7 IU/L (ref 0–32)
AST: 17 IU/L (ref 0–40)
Albumin/Globulin Ratio: 1.7 (ref 1.1–2.5)
Albumin: 4.3 g/dL (ref 3.5–5.5)
Alkaline Phosphatase: 84 IU/L (ref 39–117)
BUN/Creatinine Ratio: 9 (ref 8–20)
BUN: 6 mg/dL (ref 6–20)
Bilirubin Total: 0.3 mg/dL (ref 0.0–1.2)
CO2: 23 mmol/L (ref 18–29)
Calcium: 9.2 mg/dL (ref 8.7–10.2)
Chloride: 102 mmol/L (ref 96–106)
Creatinine, Ser: 0.69 mg/dL (ref 0.57–1.00)
GFR calc Af Amer: 138 mL/min/{1.73_m2} (ref >59)
GFR calc non Af Amer: 120 mL/min/{1.73_m2} (ref >59)
Globulin, Total: 2.5 g/dL (ref 1.5–4.5)
Glucose: 87 mg/dL (ref 65–99)
Potassium: 4.2 mmol/L (ref 3.5–5.2)
Sodium: 139 mmol/L (ref 134–144)
Total Protein: 6.8 g/dL (ref 6.0–8.5)

## 2015-04-30 LAB — THYROID PANEL WITH TSH
Free Thyroxine Index: 2.7 (ref 1.2–4.9)
T3 Uptake Ratio: 31 % (ref 24–39)
T4, Total: 8.7 ug/dL (ref 4.5–12.0)
TSH: 1.62 u[IU]/mL (ref 0.450–4.500)

## 2015-05-02 LAB — PAP IG, CT-NG, RFX HPV ASCU
Chlamydia, Nuc. Acid Amp: NEGATIVE
Gonococcus by Nucleic Acid Amp: NEGATIVE
PAP Smear Comment: 0

## 2015-05-03 ENCOUNTER — Other Ambulatory Visit: Payer: Self-pay | Admitting: Family

## 2015-05-03 DIAGNOSIS — E559 Vitamin D deficiency, unspecified: Secondary | ICD-10-CM | POA: Insufficient documentation

## 2015-05-03 MED ORDER — VITAMIN D (ERGOCALCIFEROL) 1.25 MG (50000 UNIT) PO CAPS: 50000.0000 [IU] | ORAL_CAPSULE | ORAL | Status: DC

## 2015-06-01 ENCOUNTER — Ambulatory Visit (INDEPENDENT_AMBULATORY_CARE_PROVIDER_SITE_OTHER): Payer: 59 | Admitting: Adult Health

## 2015-06-01 ENCOUNTER — Encounter: Payer: Self-pay | Admitting: Adult Health

## 2015-06-01 VITALS — BP 110/70 | HR 88 | Ht 62.0 in | Wt 129.0 lb

## 2015-06-01 DIAGNOSIS — Z3490 Encounter for supervision of normal pregnancy, unspecified, unspecified trimester: Secondary | ICD-10-CM

## 2015-06-01 DIAGNOSIS — Z3201 Encounter for pregnancy test, result positive: Secondary | ICD-10-CM | POA: Diagnosis not present

## 2015-06-01 DIAGNOSIS — N925 Other specified irregular menstruation: Secondary | ICD-10-CM

## 2015-06-01 DIAGNOSIS — O3680X0 Pregnancy with inconclusive fetal viability, not applicable or unspecified: Secondary | ICD-10-CM

## 2015-06-01 HISTORY — DX: Encounter for supervision of normal pregnancy, unspecified, unspecified trimester: Z34.90

## 2015-06-01 LAB — POCT URINE PREGNANCY: Preg Test, Ur: POSITIVE — AB

## 2015-06-01 MED ORDER — PRENATAL PLUS 27-1 MG PO TABS: 1.0000 | ORAL_TABLET | Freq: Every day | ORAL | Status: DC

## 2015-06-01 NOTE — Progress Notes (Signed)
Subjective:     Patient ID: Kristi Anderson, female   DOB: 06-Aug-1990, 25 y.o.   MRN: [id number]  HPI Kristi Anderson is a 25 year old white female, married in for a UPT, she has missed a period and had 2 HPTs.She has had some cramps, but no bleeding.She was attacked by dog in 2015 and has had 4 left ear surgeries.Has PTSD. Her husband does not know yet and has said he didn't want kids.She also has vitamin D def, and is supposed to take vitamin D 50,000 IU per week, has not started yet. PCP is [facility], and she had pap there in January, 2017.  Review of Systems Patient denies any headaches, hearing loss, fatigue, blurred vision, shortness of breath, chest pain, abdominal pain, problems with bowel movements, urination, or intercourse. No joint pain or mood swings.See HPI for positives. Reviewed past medical,surgical, social and family history. Reviewed medications and allergies.     Objective:   Physical Exam BP 110/70 mmHg  Pulse 88  Ht  (1.575 m)  Wt 129 lb (58.514 kg)  BMI 23.59 kg/m2  LMP 04/23/2015 UPT +, about 5+4 weeks by LMP with EDD 5+4 weeks, medicaid form given, Skin warm and dry. Neck: mid line trachea, normal thyroid, good ROM, no lymphadenopathy noted. Lungs: clear to ausculation bilaterally. Cardiovascular: regular rate and rhythm.Abdomen soft and non tender.   Pt made aware of answering service and to go to [facility] ER or [facility]U at [facility] if needed.  Assessment:     UPT+ Pregnant     Plan:     Rx prenatal Plus #30 take 1 daily with 11 refills Return in 2 weeks for dating [place] Review hand out on first trimester

## 2015-06-01 NOTE — Patient Instructions (Signed)
First Trimester of Pregnancy The first trimester of pregnancy is from week 1 until the end of week 12 (months 1 through 3). A week after a sperm fertilizes an egg, the egg will implant on the wall of the uterus. This embryo will begin to develop into a baby. Genes from you and your partner are forming the baby. The female genes determine whether the baby is a boy or a girl. At 6-8 weeks, the eyes and face are formed, and the heartbeat can be seen on ultrasound. At the end of 12 weeks, all the baby's organs are formed.  Now that you are pregnant, you will want to do everything you can to have a healthy baby. Two of the most important things are to get good prenatal care and to follow your health care provider's instructions. Prenatal care is all the medical care you receive before the baby's birth. This care will help prevent, find, and treat any problems during the pregnancy and childbirth. BODY CHANGES Your body goes through many changes during pregnancy. The changes vary from woman to woman.   You may gain or lose a couple of pounds at first.  You may feel sick to your stomach (nauseous) and throw up (vomit). If the vomiting is uncontrollable, call your health care provider.  You may tire easily.  You may develop headaches that can be relieved by medicines approved by your health care provider.  You may urinate more often. Painful urination may mean you have a bladder infection.  You may develop heartburn as a result of your pregnancy.  You may develop constipation because certain hormones are causing the muscles that push waste through your intestines to slow down.  You may develop hemorrhoids or swollen, bulging veins (varicose veins).  Your breasts may begin to grow larger and become tender. Your nipples may stick out more, and the tissue that surrounds them (areola) may become darker.  Your gums may bleed and may be sensitive to brushing and flossing.  Dark spots or blotches (chloasma,  mask of pregnancy) may develop on your face. This will likely fade after the baby is born.  Your menstrual periods will stop.  You may have a loss of appetite.  You may develop cravings for certain kinds of food.  You may have changes in your emotions from day to day, such as being excited to be pregnant or being concerned that something may go wrong with the pregnancy and baby.  You may have more vivid and strange dreams.  You may have changes in your hair. These can include thickening of your hair, rapid growth, and changes in texture. Some women also have hair loss during or after pregnancy, or hair that feels dry or thin. Your hair will most likely return to normal after your baby is born. WHAT TO EXPECT AT YOUR PRENATAL VISITS During a routine prenatal visit:  You will be weighed to make sure you and the baby are growing normally.  Your blood pressure will be taken.  Your abdomen will be measured to track your baby's growth.  The fetal heartbeat will be listened to starting around week 10 or 12 of your pregnancy.  Test results from any previous visits will be discussed. Your health care provider may ask you:  How you are feeling.  If you are feeling the baby move.  If you have had any abnormal symptoms, such as leaking fluid, bleeding, severe headaches, or abdominal cramping.  If you are using any tobacco products,   including cigarettes, chewing tobacco, and electronic cigarettes.  If you have any questions. Other tests that may be performed during your first trimester include:  Blood tests to find your blood type and to check for the presence of any previous infections. They will also be used to check for low iron levels (anemia) and Rh antibodies. Later in the pregnancy, blood tests for diabetes will be done along with other tests if problems develop.  Urine tests to check for infections, diabetes, or protein in the urine.  An ultrasound to confirm the proper growth  and development of the baby.  An amniocentesis to check for possible genetic problems.  Fetal screens for spina bifida and Down syndrome.  You may need other tests to make sure you and the baby are doing well.  HIV (human immunodeficiency virus) testing. Routine prenatal testing includes screening for HIV, unless you choose not to have this test. HOME CARE INSTRUCTIONS  Medicines  Follow your health care provider's instructions regarding medicine use. Specific medicines may be either safe or unsafe to take during pregnancy.  Take your prenatal vitamins as directed.  If you develop constipation, try taking a stool softener if your health care provider approves. Diet  Eat regular, well-balanced meals. Choose a variety of foods, such as meat or vegetable-based protein, fish, milk and low-fat dairy products, vegetables, fruits, and whole grain breads and cereals. Your health care provider will help you determine the amount of weight gain that is right for you.  Avoid raw meat and uncooked cheese. These carry germs that can cause birth defects in the baby.  Eating four or five small meals rather than three large meals a day may help relieve nausea and vomiting. If you start to feel nauseous, eating a few soda crackers can be helpful. Drinking liquids between meals instead of during meals also seems to help nausea and vomiting.  If you develop constipation, eat more high-fiber foods, such as fresh vegetables or fruit and whole grains. Drink enough fluids to keep your urine clear or pale yellow. Activity and Exercise  Exercise only as directed by your health care provider. Exercising will help you:  Control your weight.  Stay in shape.  Be prepared for labor and delivery.  Experiencing pain or cramping in the lower abdomen or low back is a good sign that you should stop exercising. Check with your health care provider before continuing normal exercises.  Try to avoid standing for long  periods of time. Move your legs often if you must stand in one place for a long time.  Avoid heavy lifting.  Wear low-heeled shoes, and practice good posture.  You may continue to have sex unless your health care provider directs you otherwise. Relief of Pain or Discomfort  Wear a good support bra for breast tenderness.   Take warm sitz baths to soothe any pain or discomfort caused by hemorrhoids. Use hemorrhoid cream if your health care provider approves.   Rest with your legs elevated if you have leg cramps or low back pain.  If you develop varicose veins in your legs, wear support hose. Elevate your feet for 15 minutes, 3-4 times a day. Limit salt in your diet. Prenatal Care  Schedule your prenatal visits by the twelfth week of pregnancy. They are usually scheduled monthly at first, then more often in the last 2 months before delivery.  Write down your questions. Take them to your prenatal visits.  Keep all your prenatal visits as directed by your   health care provider. Safety  Wear your seat belt at all times when driving.  Make a list of emergency phone numbers, including numbers for family, friends, the hospital, and police and fire departments. General Tips  Ask your health care provider for a referral to a local prenatal education class. Begin classes no later than at the beginning of month 6 of your pregnancy.  Ask for help if you have counseling or nutritional needs during pregnancy. Your health care provider can offer advice or refer you to specialists for help with various needs.  Do not use hot tubs, steam rooms, or saunas.  Do not douche or use tampons or scented sanitary pads.  Do not cross your legs for long periods of time.  Avoid cat litter boxes and soil used by cats. These carry germs that can cause birth defects in the baby and possibly loss of the fetus by miscarriage or stillbirth.  Avoid all smoking, herbs, alcohol, and medicines not prescribed by  your health care provider. Chemicals in these affect the formation and growth of the baby.  Do not use any tobacco products, including cigarettes, chewing tobacco, and electronic cigarettes. If you need help quitting, ask your health care provider. You may receive counseling support and other resources to help you quit.  Schedule a dentist appointment. At home, brush your teeth with a soft toothbrush and be gentle when you floss. SEEK MEDICAL CARE IF:   You have dizziness.  You have mild pelvic cramps, pelvic pressure, or nagging pain in the abdominal area.  You have persistent nausea, vomiting, or diarrhea.  You have a bad smelling vaginal discharge.  You have pain with urination.  You notice increased swelling in your face, hands, legs, or ankles. SEEK IMMEDIATE MEDICAL CARE IF:   You have a fever.  You are leaking fluid from your vagina.  You have spotting or bleeding from your vagina.  You have severe abdominal cramping or pain.  You have rapid weight gain or loss.  You vomit blood or material that looks like coffee grounds.  You are exposed to German measles and have never had them.  You are exposed to fifth disease or chickenpox.  You develop a severe headache.  You have shortness of breath.  You have any kind of trauma, such as from a fall or a car accident.   This information is not intended to replace advice given to you by your health care provider. Make sure you discuss any questions you have with your health care provider.   Document Released: 03/14/2001 Document Revised: 04/10/2014 Document Reviewed: 01/28/2013 Elsevier Interactive Patient Education 2016 [employer]. Return in 2 weeks for dating [place] 

## 2015-06-14 ENCOUNTER — Telehealth: Payer: Self-pay | Admitting: Women's Health

## 2015-06-14 NOTE — Telephone Encounter (Signed)
Left message I called 

## 2015-06-14 NOTE — Telephone Encounter (Signed)
Pt c/o nausea and vomiting early pregnancy, requesting meds.

## 2015-06-15 ENCOUNTER — Other Ambulatory Visit: Payer: 59

## 2015-06-21 ENCOUNTER — Telehealth: Payer: Self-pay | Admitting: Obstetrics & Gynecology

## 2015-06-21 NOTE — Telephone Encounter (Signed)
Pt called stating that she would like to speak with a nurse. Pt did not give her a reason why. Please contact pt

## 2015-06-21 NOTE — Telephone Encounter (Signed)
Pt requesting refill on Zofran.

## 2015-06-21 NOTE — Telephone Encounter (Signed)
Call in am for appt, is still having nausea, but is not coming here for pregnancy,

## 2015-07-13 ENCOUNTER — Encounter: Payer: 59 | Admitting: Certified Nurse Midwife

## 2015-07-13 ENCOUNTER — Encounter: Payer: Self-pay | Admitting: Obstetrics

## 2015-07-13 ENCOUNTER — Ambulatory Visit (INDEPENDENT_AMBULATORY_CARE_PROVIDER_SITE_OTHER): Payer: 59 | Admitting: Obstetrics

## 2015-07-13 VITALS — BP 130/84 | HR 116 | Wt 115.0 lb

## 2015-07-13 DIAGNOSIS — O219 Vomiting of pregnancy, unspecified: Secondary | ICD-10-CM

## 2015-07-13 DIAGNOSIS — Z3401 Encounter for supervision of normal first pregnancy, first trimester: Secondary | ICD-10-CM

## 2015-07-13 DIAGNOSIS — F4310 Post-traumatic stress disorder, unspecified: Secondary | ICD-10-CM

## 2015-07-13 DIAGNOSIS — Z3687 Encounter for antenatal screening for uncertain dates: Secondary | ICD-10-CM

## 2015-07-13 DIAGNOSIS — K219 Gastro-esophageal reflux disease without esophagitis: Secondary | ICD-10-CM

## 2015-07-13 LAB — POCT URINALYSIS DIPSTICK
Bilirubin, UA: NEGATIVE
Glucose, UA: NEGATIVE
Nitrite, UA: NEGATIVE
Spec Grav, UA: 1.02
Urobilinogen, UA: NEGATIVE
pH, UA: 6

## 2015-07-13 MED ORDER — DOXYLAMINE SUCCINATE (SLEEP) 25 MG PO TABS: 25.0000 mg | ORAL_TABLET | Freq: Every evening | ORAL | Status: DC | PRN

## 2015-07-13 MED ORDER — METOCLOPRAMIDE HCL 10 MG PO TABS: 10.0000 mg | ORAL_TABLET | Freq: Three times a day (TID) | ORAL | Status: DC

## 2015-07-13 MED ORDER — VITAMIN B-6 50 MG PO TABS: 50.0000 mg | ORAL_TABLET | Freq: Two times a day (BID) | ORAL | Status: DC

## 2015-07-13 MED ORDER — RANITIDINE HCL 150 MG PO TABS: 150.0000 mg | ORAL_TABLET | Freq: Two times a day (BID) | ORAL | Status: DC

## 2015-07-13 NOTE — Progress Notes (Signed)
Subjective:    Kristi Anderson is being seen today for her first obstetrical visit.  This is not a planned pregnancy. She is at [redacted]w[redacted]d gestation. Her obstetrical history is significant for none. Relationship with FOB: significant other, not living together. Patient does intend to breast feed. Pregnancy history fully reviewed.  The information documented in the HPI was reviewed and verified.  Menstrual History: OB History    Gravida Para Term Preterm AB TAB SAB Ectopic Multiple Living         Patient's last menstrual period was 04/23/2015 (approximate).    Past Medical History  Diagnosis Date  . Allergy     morphine and latex  . Anxiety   . Pregnant 06/01/2015  . Vitamin D deficiency   . PTSD (post-traumatic stress disorder)     had dog attack 11/15 had 4 surgeries on left ear  . PTSD (post-traumatic stress disorder)     Past Surgical History  Procedure Laterality Date  . Cosmetic surgery      reconstructive surgery after attack by dog  . External ear surgery       (Not in a hospital admission) Allergies  Allergen Reactions  . Morphine And Related Nausea Only  . Latex Swelling    Social History  Substance Use Topics  . Smoking status: Passive Smoke Exposure - Never Smoker    Types: Cigarettes  . Smokeless tobacco: Never Used  . Alcohol Use: No    Family History  Problem Relation Age of Onset  . Arthritis Mother   . Heart disease Mother   . Clotting disorder Father   . Seizures Sister   . Stroke Maternal Grandmother   . Heart disease Maternal Grandfather   . Early death Maternal Grandfather   . Arthritis Paternal Grandmother   . Diabetes Paternal Grandfather   . Heart failure Father   . Heart failure Father   . Heart failure Father   . Heart failure Father      Review of Systems Constitutional: negative for weight loss Gastrointestinal: negative for vomiting Genitourinary:negative for genital lesions and vaginal discharge and  dysuria Musculoskeletal:negative for back pain Behavioral/Psych: negative for abusive relationship, depression, illegal drug usage and tobacco use    Objective:    BP 130/84 mmHg  Pulse 116  Wt 115 lb (52.164 kg)  LMP 04/23/2015 (Approximate) General Appearance:    Alert, cooperative, no distress, appears stated age  Head:    Normocephalic, without obvious abnormality, atraumatic  Eyes:    PERRL, conjunctiva/corneas clear, EOM's intact, fundi    benign, both eyes  Ears:    Normal TM's and external ear canals, both ears  Nose:   Nares normal, septum midline, mucosa normal, no drainage    or sinus tenderness  Throat:   Lips, mucosa, and tongue normal; teeth and gums normal  Neck:   Supple, symmetrical, trachea midline, no adenopathy;    thyroid:  no enlargement/tenderness/nodules; no carotid   bruit or JVD  Back:     Symmetric, no curvature, ROM normal, no CVA tenderness  Lungs:     Clear to auscultation bilaterally, respirations unlabored  Chest Wall:    No tenderness or deformity   Heart:    Regular rate and rhythm, S1 and S2 normal, no murmur, rub   or gallop  Breast Exam:    No tenderness, masses, or nipple abnormality  Abdomen:     Soft, non-tender, bowel sounds active all four  quadrants,    no masses, no organomegaly  Genitalia:    Normal female without lesion, discharge or tenderness  Extremities:   Extremities normal, atraumatic, no cyanosis or edema  Pulses:   2+ and symmetric all extremities  Skin:   Skin color, texture, turgor normal, no rashes or lesions  Lymph nodes:   Cervical, supraclavicular, and axillary nodes normal  Neurologic:   CNII-XII intact, normal strength, sensation and reflexes    throughout      Lab Review Urine pregnancy test Labs reviewed yes Radiologic studies reviewed no Assessment:    Pregnancy at 2458w4d weeks    Plan:      Prenatal vitamins.  Counseling provided regarding continued use of seat belts, cessation of alcohol consumption,  smoking or use of illicit drugs; infection precautions i.e., influenza/TDAP immunizations, toxoplasmosis,CMV, parvovirus, listeria and varicella; workplace safety, exercise during pregnancy; routine dental care, safe medications, sexual activity, hot tubs, saunas, pools, travel, caffeine use, fish and methlymercury, potential toxins, hair treatments, varicose veins Weight gain recommendations per IOM guidelines reviewed: underweight/BMI< 18.5--> gain 28 - 40 lbs; normal weight/BMI 18.5 - 24.9--> gain 25 - 35 lbs; overweight/BMI 25 - 29.9--> gain 15 - 25 lbs; obese/BMI >30->gain  11 - 20 lbs Problem list reviewed and updated. FIRST/CF mutation testing/NIPT/QUAD SCREEN/fragile X/Ashkenazi Jewish population testing/Spinal muscular atrophy discussed: requested. Role of ultrasound in pregnancy discussed; fetal survey: requested. Amniocentesis discussed: not indicated. VBAC calculator score: VBAC consent form provided  Meds ordered this encounter  Medications  . metoCLOPramide (REGLAN) 10 MG tablet    Sig: Take 1 tablet (10 mg total) by mouth 3 (three) times daily before meals.    Dispense:  90 tablet    Refill:  5  . ranitidine (ZANTAC) 150 MG tablet    Sig: Take 1 tablet (150 mg total) by mouth 2 (two) times daily.    Dispense:  60 tablet    Refill:  5  . pyridOXINE (VITAMIN B-6) 50 MG tablet    Sig: Take 1 tablet (50 mg total) by mouth 2 (two) times daily at 8 am and 10 pm.    Dispense:  60 tablet    Refill:  5  . doxylamine, Sleep, (UNISOM) 25 MG tablet    Sig: Take 1 tablet (25 mg total) by mouth at bedtime as needed.    Dispense:  30 tablet    Refill:  5   Orders Placed This Encounter  Procedures  . Culture, OB Urine  . US OB Comp Less 14 Wks    Standing Status: Future     Number of Occurrences:      Standing Expiration Date: 09/11/2016    Order Specific Question:  Reason for Exam (SYMPTOM  OR DIAGNOSIS REQUIRED)    Answer:  unsure lmp    Order Specific Question:  Preferred  imaging location?    Answer:  Internal  . US OB Transvaginal    Standing Status: Future     Number of Occurrences:      Standing Expiration Date: 09/11/2016    Order Specific Question:  Reason for Exam (SYMPTOM  OR DIAGNOSIS REQUIRED)    Answer:  unsure lmp    Order Specific Question:  Preferred imaging location?    Answer:  Internal  . NuSwab Vaginitis Plus (VG+)  . HIV antibody  . Hemoglobinopathy evaluation  . Varicella zoster antibody, IgG  . VITAMIN D 25 Hydroxy (Vit-D Deficiency, Fractures)  . Prenatal Profile I  . POCT urinalysis dipstick  Follow up in 4 weeks.

## 2015-07-15 ENCOUNTER — Ambulatory Visit (INDEPENDENT_AMBULATORY_CARE_PROVIDER_SITE_OTHER): Payer: 59

## 2015-07-15 ENCOUNTER — Telehealth: Payer: Self-pay | Admitting: *Deleted

## 2015-07-15 ENCOUNTER — Encounter: Payer: Self-pay | Admitting: Obstetrics

## 2015-07-15 DIAGNOSIS — O3680X1 Pregnancy with inconclusive fetal viability, fetus 1: Secondary | ICD-10-CM | POA: Diagnosis not present

## 2015-07-15 DIAGNOSIS — Z3687 Encounter for antenatal screening for uncertain dates: Secondary | ICD-10-CM

## 2015-07-15 LAB — PRENATAL PROFILE I(LABCORP)
Antibody Screen: NEGATIVE
Basophils Absolute: 0 10*3/uL (ref 0.0–0.2)
Basos: 0 %
EOS (ABSOLUTE): 0 10*3/uL (ref 0.0–0.4)
Eos: 0 %
Hematocrit: 43.8 % (ref 34.0–46.6)
Hemoglobin: 15 g/dL (ref 11.1–15.9)
Hepatitis B Surface Ag: NEGATIVE
Immature Grans (Abs): 0 10*3/uL (ref 0.0–0.1)
Immature Granulocytes: 0 %
Lymphocytes Absolute: 1.9 10*3/uL (ref 0.7–3.1)
Lymphs: 21 %
MCH: 30.2 pg (ref 26.6–33.0)
MCHC: 34.2 g/dL (ref 31.5–35.7)
MCV: 88 fL (ref 79–97)
Monocytes Absolute: 0.6 10*3/uL (ref 0.1–0.9)
Monocytes: 7 %
Neutrophils Absolute: 6.5 10*3/uL (ref 1.4–7.0)
Neutrophils: 72 %
Platelets: 239 10*3/uL (ref 150–379)
RBC: 4.97 x10E6/uL (ref 3.77–5.28)
RDW: 13.4 % (ref 12.3–15.4)
RPR Ser Ql: NONREACTIVE
Rh Factor: NEGATIVE
Rubella Antibodies, IGG: 4.64 index (ref >0.99)
WBC: 9.1 10*3/uL (ref 3.4–10.8)

## 2015-07-15 LAB — URINE CULTURE, OB REFLEX

## 2015-07-15 LAB — VARICELLA ZOSTER ANTIBODY, IGG: Varicella zoster IgG: 570 index (ref >165)

## 2015-07-15 LAB — HEMOGLOBINOPATHY EVALUATION
HGB C: 0 %
HGB S: 0 %
Hemoglobin A2 Quantitation: 2.4 % (ref 0.7–3.1)
Hemoglobin F Quantitation: 0 % (ref 0.0–2.0)
Hgb A: 97.6 % (ref 94.0–98.0)

## 2015-07-15 LAB — HIV ANTIBODY (ROUTINE TESTING W REFLEX): HIV Screen 4th Generation wRfx: NONREACTIVE

## 2015-07-15 LAB — CULTURE, OB URINE

## 2015-07-15 LAB — VITAMIN D 25 HYDROXY (VIT D DEFICIENCY, FRACTURES): Vit D, 25-Hydroxy: 18.8 ng/mL — ABNORMAL LOW (ref 30.0–100.0)

## 2015-07-15 NOTE — Telephone Encounter (Signed)
Patient requested call back 4:06 LM on VM to CB

## 2015-07-15 NOTE — Telephone Encounter (Signed)
Spoke with pt regarding Rx for PNV. Pt was made aware that Dr Clearance CootsHarper would like for her to use PNV that she was given earlier this pregnancy. Pt states that she is currently having sever N&V.  Pt advised to not take PNV at this time until she can get N&V under control with Rx given at visit.  Pt advised to start PNV once she feels that she can tolerate them and not having any vomiting episodes.

## 2015-07-16 ENCOUNTER — Other Ambulatory Visit: Payer: Self-pay | Admitting: Obstetrics

## 2015-07-16 DIAGNOSIS — B9689 Other specified bacterial agents as the cause of diseases classified elsewhere: Secondary | ICD-10-CM

## 2015-07-16 DIAGNOSIS — N760 Acute vaginitis: Principal | ICD-10-CM

## 2015-07-16 LAB — NUSWAB VAGINITIS PLUS (VG+)
Atopobium vaginae: HIGH Score — AB
BVAB 2: HIGH Score — AB
Candida albicans, NAA: NEGATIVE
Candida glabrata, NAA: NEGATIVE
Chlamydia trachomatis, NAA: NEGATIVE
Megasphaera 1: HIGH Score — AB
Neisseria gonorrhoeae, NAA: NEGATIVE
Trich vag by NAA: NEGATIVE

## 2015-07-16 MED ORDER — METRONIDAZOLE 500 MG PO TABS: 500.0000 mg | ORAL_TABLET | Freq: Two times a day (BID) | ORAL | Status: DC

## 2015-07-27 ENCOUNTER — Encounter: Payer: Self-pay | Admitting: *Deleted

## 2015-07-27 ENCOUNTER — Ambulatory Visit (INDEPENDENT_AMBULATORY_CARE_PROVIDER_SITE_OTHER): Payer: 59 | Admitting: Obstetrics

## 2015-07-27 ENCOUNTER — Encounter: Payer: Self-pay | Admitting: Obstetrics

## 2015-07-27 VITALS — BP 119/84 | HR 87 | Wt 122.0 lb

## 2015-07-27 DIAGNOSIS — Z3492 Encounter for supervision of normal pregnancy, unspecified, second trimester: Secondary | ICD-10-CM

## 2015-07-27 LAB — POCT URINALYSIS DIPSTICK
Bilirubin, UA: NEGATIVE
Glucose, UA: NEGATIVE
Ketones, UA: NEGATIVE
Leukocytes, UA: NEGATIVE
Nitrite, UA: NEGATIVE
Protein, UA: NEGATIVE
Spec Grav, UA: 1.015
Urobilinogen, UA: NEGATIVE
pH, UA: 5

## 2015-07-27 NOTE — Progress Notes (Signed)
  Subjective:    Kristi Anderson is a 25 y.o. female being seen today for her obstetrical visit. She is at 1327w4d gestation. Patient reports: no complaints.  Problem List Items Addressed This Visit    None    Visit Diagnoses    Prenatal care, second trimester    -  Primary    Relevant Orders    POCT urinalysis dipstick (Completed)      Patient Active Problem List   Diagnosis Date Noted  . Pregnant 06/01/2015  . Vitamin D deficiency 05/03/2015  . Post traumatic stress disorder 11/18/2014  . Counseling for initiation of birth control method 11/18/2014    Objective:     BP 119/84 mmHg  Pulse 87  Wt 122 lb (55.339 kg)  LMP 04/23/2015 (Approximate) Uterine Size: Below umbilicus     Assessment:    Pregnancy @ 8427w4d  weeks Doing well    Plan:    Problem list reviewed and updated. Labs reviewed.  Follow up in 4 weeks. FIRST/CF mutation testing/NIPT/QUAD SCREEN/fragile X/Ashkenazi Jewish population testing/Spinal muscular atrophy discussed: requested. Role of ultrasound in pregnancy discussed; fetal survey: requested. Amniocentesis discussed: not indicated.

## 2015-07-27 NOTE — Progress Notes (Signed)
Patient is having cramping. Her supervisor is having her lift heavy tv's, mattress, box springs. Asking if maybe she can a letter stating no heavy lifting.

## 2015-08-02 ENCOUNTER — Other Ambulatory Visit: Payer: Self-pay | Admitting: *Deleted

## 2015-08-02 DIAGNOSIS — Z3490 Encounter for supervision of normal pregnancy, unspecified, unspecified trimester: Secondary | ICD-10-CM

## 2015-08-02 MED ORDER — PRENATE PIXIE 10-0.6-0.4-200 MG PO CAPS: 1.0000 | ORAL_CAPSULE | Freq: Every day | ORAL | Status: DC

## 2015-08-10 ENCOUNTER — Telehealth: Payer: Self-pay | Admitting: *Deleted

## 2015-08-10 ENCOUNTER — Ambulatory Visit (INDEPENDENT_AMBULATORY_CARE_PROVIDER_SITE_OTHER): Payer: 59 | Admitting: Obstetrics

## 2015-08-10 ENCOUNTER — Encounter: Payer: Self-pay | Admitting: Obstetrics

## 2015-08-10 VITALS — BP 114/81 | HR 91 | Wt 121.0 lb

## 2015-08-10 DIAGNOSIS — Z3492 Encounter for supervision of normal pregnancy, unspecified, second trimester: Secondary | ICD-10-CM

## 2015-08-10 LAB — POCT URINALYSIS DIPSTICK
Bilirubin, UA: NEGATIVE
Glucose, UA: NEGATIVE
Ketones, UA: NEGATIVE
Leukocytes, UA: NEGATIVE
Nitrite, UA: NEGATIVE
Spec Grav, UA: 1.025
Urobilinogen, UA: NEGATIVE
pH, UA: 5

## 2015-08-10 NOTE — Telephone Encounter (Signed)
Attempt to contact pt regarding quad screen.  Was not drawn at today's visit. LM on VM making pt aware that we could schedule her for lab appt if she would like to have lab work. Advised to call office if she would like to schedule.

## 2015-08-10 NOTE — Progress Notes (Signed)
  Subjective:    Pricilla Rifflelizabeth Massett is a 25 y.o. female being seen today for her obstetrical visit. She is at 6247w4d gestation. Patient reports: no complaints.  Problem List Items Addressed This Visit    None    Visit Diagnoses    Prenatal care in second trimester    -  Primary    Relevant Orders    POCT Urinalysis Dipstick (Completed)    US OB Comp + 14 Wk      Patient Active Problem List   Diagnosis Date Noted  . Pregnant 06/01/2015  . Vitamin D deficiency 05/03/2015  . Post traumatic stress disorder 11/18/2014  . Counseling for initiation of birth control method 11/18/2014    Objective:     BP 114/81 mmHg  Pulse 91  Wt 121 lb (54.885 kg)  LMP 04/23/2015 (Approximate) Uterine Size: Below umbilicus     Assessment:    Pregnancy @ 647w4d  weeks Doing well    Plan:    Problem list reviewed and updated. Labs reviewed.  Follow up in 4 weeks. FIRST/CF mutation testing/NIPT/QUAD SCREEN/fragile X/Ashkenazi Jewish population testing/Spinal muscular atrophy discussed: requested. Role of ultrasound in pregnancy discussed; fetal survey: requested. Amniocentesis discussed: not indicated.

## 2015-08-18 ENCOUNTER — Other Ambulatory Visit: Payer: Self-pay

## 2015-08-23 ENCOUNTER — Other Ambulatory Visit: Payer: Self-pay

## 2015-08-31 ENCOUNTER — Encounter: Payer: Self-pay | Admitting: Obstetrics

## 2015-08-31 ENCOUNTER — Ambulatory Visit (INDEPENDENT_AMBULATORY_CARE_PROVIDER_SITE_OTHER): Payer: 59

## 2015-08-31 ENCOUNTER — Ambulatory Visit (INDEPENDENT_AMBULATORY_CARE_PROVIDER_SITE_OTHER): Payer: 59 | Admitting: Obstetrics

## 2015-08-31 ENCOUNTER — Other Ambulatory Visit: Payer: 59

## 2015-08-31 VITALS — BP 102/71 | HR 84 | Wt 125.0 lb

## 2015-08-31 DIAGNOSIS — Z36 Encounter for antenatal screening of mother: Secondary | ICD-10-CM | POA: Diagnosis not present

## 2015-08-31 DIAGNOSIS — Z3402 Encounter for supervision of normal first pregnancy, second trimester: Secondary | ICD-10-CM

## 2015-08-31 DIAGNOSIS — Z3492 Encounter for supervision of normal pregnancy, unspecified, second trimester: Secondary | ICD-10-CM

## 2015-08-31 LAB — POCT URINALYSIS DIPSTICK
Bilirubin, UA: NEGATIVE
Blood, UA: NEGATIVE
Glucose, UA: NEGATIVE
Ketones, UA: NEGATIVE
Leukocytes, UA: NEGATIVE
Nitrite, UA: NEGATIVE
Protein, UA: NEGATIVE
Spec Grav, UA: <=1.005
Urobilinogen, UA: NEGATIVE
pH, UA: 6

## 2015-08-31 NOTE — Progress Notes (Signed)
  Subjective:    Kristi Anderson is a 25 y.o. female being seen today for her obstetrical visit. She is at 6670w4d gestation. Patient reports: no complaints.  Problem List Items Addressed This Visit    None    Visit Diagnoses    Encounter for supervision of normal first pregnancy in second trimester    -  Primary    Relevant Orders    AFP, Quad Screen    POCT urinalysis dipstick (Completed)      Patient Active Problem List   Diagnosis Date Noted  . Pregnant 06/01/2015  . Vitamin D deficiency 05/03/2015  . Post traumatic stress disorder 11/18/2014  . Counseling for initiation of birth control method 11/18/2014    Objective:     BP 102/71 mmHg  Pulse 84  Wt 125 lb (56.7 kg)  LMP 04/23/2015 (Approximate) Uterine Size: Below umbilicus     Assessment:    Pregnancy @ 6970w4d  weeks Doing well    Plan:    Problem list reviewed and updated. Labs reviewed.  Follow up in 4 weeks. FIRST/CF mutation testing/NIPT/QUAD SCREEN/fragile X/Ashkenazi Jewish population testing/Spinal muscular atrophy discussed: requested. Role of ultrasound in pregnancy discussed; fetal survey: requested. Amniocentesis discussed: not indicated.

## 2015-09-02 LAB — AFP, QUAD SCREEN
DIA Mom Value: 1.84
DIA Value (EIA): 370.07 pg/mL
DSR (By Age)    1 IN: 878
DSR (Second Trimester) 1 IN: 5616
Gestational Age: 18.6 WEEKS
MSAFP Mom: 1.27
MSAFP: 66.5 ng/mL
MSHCG Mom: 2.73
MSHCG: 82432 m[IU]/mL
Maternal Age At EDD: 27.7 YEARS
Osb Risk: 5251
T18 (By Age): 1:3420 {titer}
Test Results:: NEGATIVE
Weight: 125 [lb_av]
uE3 Mom: 1.47
uE3 Value: 2.35 ng/mL

## 2015-09-28 ENCOUNTER — Encounter: Payer: Self-pay | Admitting: Obstetrics

## 2015-09-28 ENCOUNTER — Ambulatory Visit (INDEPENDENT_AMBULATORY_CARE_PROVIDER_SITE_OTHER): Payer: 59 | Admitting: Obstetrics

## 2015-09-28 VITALS — BP 112/72 | HR 89 | Wt 131.0 lb

## 2015-09-28 DIAGNOSIS — Z3402 Encounter for supervision of normal first pregnancy, second trimester: Secondary | ICD-10-CM

## 2015-09-28 LAB — POCT URINALYSIS DIPSTICK
Spec Grav, UA: 1.02
pH, UA: 7

## 2015-09-28 NOTE — Progress Notes (Signed)
Subjective:    Kristi Anderson is a 25 y.o. female being seen today for her obstetrical visit. She is at 262w4d gestation. Patient reports: no complaints . Fetal movement: normal.  Problem List Items Addressed This Visit    None    Visit Diagnoses    Encounter for supervision of normal first pregnancy in second trimester    -  Primary    Relevant Orders    POCT urinalysis dipstick (Completed)      Patient Active Problem List   Diagnosis Date Noted  . Pregnant 06/01/2015  . Vitamin D deficiency 05/03/2015  . Post traumatic stress disorder 11/18/2014  . Counseling for initiation of birth control method 11/18/2014   Objective:    BP 112/72 mmHg  Pulse 89  Wt 131 lb (59.421 kg)  LMP 04/23/2015 (Approximate) FHT: 150 BPM  Uterine Size: size equals dates     Assessment:    Pregnancy @ 7562w4d    Plan:    Signs and symptoms of preterm labor: discussed.  Labs, problem list reviewed and updated 2 hr GTT planned Follow up in 4 weeks.

## 2015-10-26 ENCOUNTER — Ambulatory Visit (INDEPENDENT_AMBULATORY_CARE_PROVIDER_SITE_OTHER): Payer: 59 | Admitting: Certified Nurse Midwife

## 2015-10-26 VITALS — BP 110/75 | HR 87 | Temp 97.6°F | Wt 136.4 lb

## 2015-10-26 DIAGNOSIS — Z3402 Encounter for supervision of normal first pregnancy, second trimester: Secondary | ICD-10-CM

## 2015-10-26 LAB — POCT URINALYSIS DIPSTICK
Bilirubin, UA: NEGATIVE
Blood, UA: NEGATIVE
Glucose, UA: NEGATIVE
Ketones, UA: NEGATIVE
Leukocytes, UA: NEGATIVE
Nitrite, UA: NEGATIVE
Protein, UA: NEGATIVE
Spec Grav, UA: 1.01
Urobilinogen, UA: NEGATIVE
pH, UA: 6

## 2015-10-26 NOTE — Progress Notes (Signed)
Subjective:    Kristi Anderson is a 25 y.o. female being seen today for her obstetrical visit. She is at [redacted]w[redacted]d gestation. Patient reports: fatigue, no bleeding, no contractions, no leaking and occasional cramping . Fetal movement: normal.  Problem List Items Addressed This Visit    None    Visit Diagnoses    Encounter for supervision of normal first pregnancy in second trimester    -  Primary   Relevant Orders   POCT urinalysis dipstick (Completed)     Patient Active Problem List   Diagnosis Date Noted  . Pregnant 06/01/2015  . Vitamin D deficiency 05/03/2015  . Post traumatic stress disorder 11/18/2014  . Counseling for initiation of birth control method 11/18/2014   Objective:    BP 110/75   Pulse 87   Temp 97.6 F (36.4 C)   Wt 136 lb 6.4 oz (61.9 kg)   LMP 04/23/2015 (Approximate)   BMI 24.95 kg/m  FHT: 148 BPM  Uterine Size: 25CM, S<D.     Assessment:    Pregnancy @ [redacted]w[redacted]d    Plan:    OBGCT: ordered for next visit. Signs and symptoms of preterm labor: discussed.  Labs, problem list reviewed and updated 2 hr GTT planned Follow up in 2 weeks.

## 2015-10-26 NOTE — Patient Instructions (Addendum)
Before [facility] Before your baby arrives it is important to:  Have all of the supplies that you will need to care for your baby.  Know where to go if there is an emergency.  Discuss the baby's arrival with other family members. WHAT SUPPLIES WILL I NEED? It is recommended that you have the following supplies: Large Items  Crib.  Crib mattress.  Rear-facing infant car seat. If possible, have a trained professional check to make sure that it is installed correctly. Feeding  6-8 bottles that are 4-5 oz in size.  6-8 nipples.  Bottle brush.  Sterilizer, or a large pan or kettle with a lid.  A way to boil and cool water.  If you will be breastfeeding:  Breast pump.  Nipple cream.  Nursing bra.  Breast pads.  Breast shields.  If you will be formula feeding:  Formula.  Measuring cups.  Measuring spoons. Bathing  Mild baby soap and baby shampoo.  Petroleum jelly.  Soft cloth towel and washcloth.  Hooded towel.  Cotton balls.  Bath basin. Other Supplies  Rectal thermometer.  Bulb syringe.  Baby wipes or washcloths for diaper changes.  Diaper bag.  Changing pad.  Clothing, including one-piece outfits and pajamas.  Baby nail clippers.  Receiving blankets.  Mattress pad and sheets for the crib.  Night-light for the baby's room.  Baby monitor.  2 or 3 pacifiers.  Either 24-36 cloth diapers and waterproof diaper covers or a box of disposable diapers. You may need to use as many as 10-12 diapers per day. HOW DO I PREPARE FOR AN EMERGENCY? Prepare for an emergency by:  Knowing how to get to the nearest hospital.  Listing the phone numbers of your baby's health care providers near your home phone and in your cell phone. HOW DO I PREPARE MY FAMILY?  Decide how to handle visitors.  If you have other children:  Talk with them about the baby coming home. Ask them how they feel about it.  Read a book together about being a new big  brother or sister.  Find ways to let them help you prepare for the new baby.  Have someone ready to care for them while you are in the hospital.   This information is not intended to replace advice given to you by your health care provider. Make sure you discuss any questions you have with your health care provider.   Document Released: 03/02/2008 Document Revised: 08/04/2014 Document Reviewed: 02/25/2014 Elsevier Interactive Patient Education 2016 [employer]. Dental Work and Pregnancy Proper dental care before, during, and after pregnancy is important for you and your baby. Pregnancy hormones can sometimes cause the gums to swell, which makes it easier for food to become trapped between teeth. The health of your teeth and gums can affect your growing baby.  DENTAL CARE RECOMMENDATIONS To help prevent infection and maintain healthy teeth and gums, a thorough oral examination is recommended for all women in the first trimester of pregnancy. Routine cleanings and examinations are recommended throughout pregnancy.  DENTAL CARE CONSIDERATIONS  Tell your dentist if you are pregnant or want to become pregnant.   If you are pregnant, routine X-ray exams should be avoided until after your baby is born. However, they do not need to be avoided if you are trying to become pregnant.   If you need an emergency procedure that includes a dental X-ray exam during pregnancy, very low levels of radiation will be emitted from the X-ray machines.  Lead aprons can be used for protection of the chest, abdomen, and thyroid.  Your dentist will help you weigh the risks and benefits of dental procedures during pregnancy. If possible, it is best to have dental procedures (such as cavity fillings and crown repair) during the second trimester of pregnancy or after the baby is born.   If you and your dentist decide to postpone a procedure for any reason, your dentist can suggest treatment to reduce the chance of  infection until the procedure is performed.  HOME CARE INSTRUCTIONS   Follow good oral hygiene habits at home.  Brush your teeth twice daily with fluoride toothpaste. Brush thoroughly for at least 2 minutes. Avoid strong flavored toothpastes if you have morning sickness.   Floss at least once daily.   Eat a well-balanced diet low in sugar and carbohydrates.   See your dentist for normal interval oral examinations and cleanings.   If you vomit, rinse your mouth with water afterward.   Make a dental appointment if you experience oral problems.   Follow up with your dentist as directed.  SEEK DENTAL CARE IF:  Oral symptoms such as pain, bleeding, swelling, or inflammation develop or worsen.   You develop growths or swelling in between teeth.   You develop signs of infection, such as:   A fever of more than 100.5 F (38.1 C).   Chills.   This information is not intended to replace advice given to you by your health care provider. Make sure you discuss any questions you have with your health care provider.   Document Released: 09/07/2009 Document Revised: 01/08/2013 Document Reviewed: 08/21/2012 Elsevier Interactive Patient Education [employer] Third Trimester of Pregnancy The third trimester is from week 29 through week 42, months 7 through 9. The third trimester is a time when the fetus is growing rapidly. At the end of the ninth month, the fetus is about 20 inches in length and weighs 6-10 pounds.  BODY CHANGES Your body goes through many changes during pregnancy. The changes vary from woman to woman.   Your weight will continue to increase. You can expect to gain 25-35 pounds (11-16 kg) by the end of the pregnancy.  You may begin to get stretch marks on your hips, abdomen, and breasts.  You may urinate more often because the fetus is moving lower into your pelvis and pressing on your bladder.  You may develop or continue to have heartburn as a  result of your pregnancy.  You may develop constipation because certain hormones are causing the muscles that push waste through your intestines to slow down.  You may develop hemorrhoids or swollen, bulging veins (varicose veins).  You may have pelvic pain because of the weight gain and pregnancy hormones relaxing your joints between the bones in your pelvis. Backaches may result from overexertion of the muscles supporting your posture.  You may have changes in your hair. These can include thickening of your hair, rapid growth, and changes in texture. Some women also have hair loss during or after pregnancy, or hair that feels dry or thin. Your hair will most likely return to normal after your baby is born.  Your breasts will continue to grow and be tender. A yellow discharge may leak from your breasts called colostrum.  Your belly button may stick out.  You may feel short of breath because of your expanding uterus.  You may notice the fetus "dropping," or moving lower in your abdomen.  You may  have a bloody mucus discharge. This usually occurs a few days to a week before labor begins.  Your cervix becomes thin and soft (effaced) near your due date. WHAT TO EXPECT AT YOUR PRENATAL EXAMS  You will have prenatal exams every 2 weeks until week 36. Then, you will have weekly prenatal exams. During a routine prenatal visit:  You will be weighed to make sure you and the fetus are growing normally.  Your blood pressure is taken.  Your abdomen will be measured to track your baby's growth.  The fetal heartbeat will be listened to.  Any test results from the previous visit will be discussed.  You may have a cervical check near your due date to see if you have effaced. At around 36 weeks, your caregiver will check your cervix. At the same time, your caregiver will also perform a test on the secretions of the vaginal tissue. This test is to determine if a type of bacteria, Group B  streptococcus, is present. Your caregiver will explain this further. Your caregiver may ask you:  What your birth plan is.  How you are feeling.  If you are feeling the baby move.  If you have had any abnormal symptoms, such as leaking fluid, bleeding, severe headaches, or abdominal cramping.  If you are using any tobacco products, including cigarettes, chewing tobacco, and electronic cigarettes.  If you have any questions. Other tests or screenings that may be performed during your third trimester include:  Blood tests that check for low iron levels (anemia).  Fetal testing to check the health, activity level, and growth of the fetus. Testing is done if you have certain medical conditions or if there are problems during the pregnancy.  HIV (human immunodeficiency virus) testing. If you are at high risk, you may be screened for HIV during your third trimester of pregnancy. FALSE LABOR You may feel small, irregular contractions that eventually go away. These are called Braxton Hicks contractions, or false labor. Contractions may last for hours, days, or even weeks before true labor sets in. If contractions come at regular intervals, intensify, or become painful, it is best to be seen by your caregiver.  SIGNS OF LABOR   Menstrual-like cramps.  Contractions that are 5 minutes apart or less.  Contractions that start on the top of the uterus and spread down to the lower abdomen and back.  A sense of increased pelvic pressure or back pain.  A watery or bloody mucus discharge that comes from the vagina. If you have any of these signs before the 37th week of pregnancy, call your caregiver right away. You need to go to the hospital to get checked immediately. HOME CARE INSTRUCTIONS   Avoid all smoking, herbs, alcohol, and unprescribed drugs. These chemicals affect the formation and growth of the baby.  Do not use any tobacco products, including cigarettes, chewing tobacco, and  electronic cigarettes. If you need help quitting, ask your health care provider. You may receive counseling support and other resources to help you quit.  Follow your caregiver's instructions regarding medicine use. There are medicines that are either safe or unsafe to take during pregnancy.  Exercise only as directed by your caregiver. Experiencing uterine cramps is a good sign to stop exercising.  Continue to eat regular, healthy meals.  Wear a good support bra for breast tenderness.  Do not use hot tubs, steam rooms, or saunas.  Wear your seat belt at all times when driving.  Avoid raw meat, uncooked  cheese, cat litter boxes, and soil used by cats. These carry germs that can cause birth defects in the baby.  Take your prenatal vitamins.  Take 1500-2000 mg of calcium daily starting at the 20th week of pregnancy until you deliver your baby.  Try taking a stool softener (if your caregiver approves) if you develop constipation. Eat more high-fiber foods, such as fresh vegetables or fruit and whole grains. Drink plenty of fluids to keep your urine clear or pale yellow.  Take warm sitz baths to soothe any pain or discomfort caused by hemorrhoids. Use hemorrhoid cream if your caregiver approves.  If you develop varicose veins, wear support hose. Elevate your feet for 15 minutes, 3-4 times a day. Limit salt in your diet.  Avoid heavy lifting, wear low heal shoes, and practice good posture.  Rest a lot with your legs elevated if you have leg cramps or low back pain.  Visit your dentist if you have not gone during your pregnancy. Use a soft toothbrush to brush your teeth and be gentle when you floss.  A sexual relationship may be continued unless your caregiver directs you otherwise.  Do not travel far distances unless it is absolutely necessary and only with the approval of your caregiver.  Take prenatal classes to understand, practice, and ask questions about the labor and  delivery.  Make a trial run to the hospital.  Pack your hospital bag.  Prepare the baby's nursery.  Continue to go to all your prenatal visits as directed by your caregiver. SEEK MEDICAL CARE IF:  You are unsure if you are in labor or if your water has broken.  You have dizziness.  You have mild pelvic cramps, pelvic pressure, or nagging pain in your abdominal area.  You have persistent nausea, vomiting, or diarrhea.  You have a bad smelling vaginal discharge.  You have pain with urination. SEEK IMMEDIATE MEDICAL CARE IF:   You have a fever.  You are leaking fluid from your vagina.  You have spotting or bleeding from your vagina.  You have severe abdominal cramping or pain.  You have rapid weight loss or gain.  You have shortness of breath with chest pain.  You notice sudden or extreme swelling of your face, hands, ankles, feet, or legs.  You have not felt your baby move in over an hour.  You have severe headaches that do not go away with medicine.  You have vision changes.   This information is not intended to replace advice given to you by your health care provider. Make sure you discuss any questions you have with your health care provider.   Document Released: 03/14/2001 Document Revised: 04/10/2014 Document Reviewed: 05/21/2012 Elsevier Interactive Patient Education [employer]

## 2015-11-01 ENCOUNTER — Other Ambulatory Visit: Payer: 59

## 2015-11-01 DIAGNOSIS — Z3403 Encounter for supervision of normal first pregnancy, third trimester: Secondary | ICD-10-CM

## 2015-11-02 LAB — RPR: RPR Ser Ql: NONREACTIVE

## 2015-11-02 LAB — CBC
Hematocrit: 41.5 % (ref 34.0–46.6)
Hemoglobin: 13.9 g/dL (ref 11.1–15.9)
MCH: 32 pg (ref 26.6–33.0)
MCHC: 33.5 g/dL (ref 31.5–35.7)
MCV: 95 fL (ref 79–97)
Platelets: 225 10*3/uL (ref 150–379)
RBC: 4.35 x10E6/uL (ref 3.77–5.28)
RDW: 13.2 % (ref 12.3–15.4)
WBC: 12 10*3/uL — ABNORMAL HIGH (ref 3.4–10.8)

## 2015-11-02 LAB — GLUCOSE TOLERANCE, 2 HOURS W/ 1HR
Glucose, 1 hour: 159 mg/dL (ref 65–179)
Glucose, 2 hour: 117 mg/dL (ref 65–152)
Glucose, Fasting: 78 mg/dL (ref 65–91)

## 2015-11-02 LAB — HIV ANTIBODY (ROUTINE TESTING W REFLEX): HIV Screen 4th Generation wRfx: NONREACTIVE

## 2015-11-08 ENCOUNTER — Inpatient Hospital Stay (HOSPITAL_COMMUNITY)
Admission: AD | Admit: 2015-11-08 | Discharge: 2015-11-08 | Disposition: A | Discharge status: Home / Self Care | Payer: Medicaid Other | Source: Ambulatory Visit | Attending: Obstetrics & Gynecology | Admitting: Obstetrics & Gynecology

## 2015-11-08 ENCOUNTER — Encounter (HOSPITAL_COMMUNITY): Payer: Self-pay | Admitting: *Deleted

## 2015-11-08 DIAGNOSIS — O99343 Other mental disorders complicating pregnancy, third trimester: Secondary | ICD-10-CM | POA: Insufficient documentation

## 2015-11-08 DIAGNOSIS — Z885 Allergy status to narcotic agent status: Secondary | ICD-10-CM | POA: Insufficient documentation

## 2015-11-08 DIAGNOSIS — Z7722 Contact with and (suspected) exposure to environmental tobacco smoke (acute) (chronic): Secondary | ICD-10-CM | POA: Diagnosis not present

## 2015-11-08 DIAGNOSIS — E559 Vitamin D deficiency, unspecified: Secondary | ICD-10-CM | POA: Diagnosis not present

## 2015-11-08 DIAGNOSIS — Z91040 Latex allergy status: Secondary | ICD-10-CM | POA: Diagnosis not present

## 2015-11-08 DIAGNOSIS — Z3A29 29 weeks gestation of pregnancy: Secondary | ICD-10-CM | POA: Diagnosis not present

## 2015-11-08 DIAGNOSIS — N898 Other specified noninflammatory disorders of vagina: Secondary | ICD-10-CM | POA: Diagnosis not present

## 2015-11-08 DIAGNOSIS — F4310 Post-traumatic stress disorder, unspecified: Secondary | ICD-10-CM | POA: Diagnosis not present

## 2015-11-08 DIAGNOSIS — O26893 Other specified pregnancy related conditions, third trimester: Secondary | ICD-10-CM | POA: Diagnosis not present

## 2015-11-08 LAB — URINALYSIS, ROUTINE W REFLEX MICROSCOPIC
Bilirubin Urine: NEGATIVE
Glucose, UA: NEGATIVE mg/dL
Ketones, ur: NEGATIVE mg/dL
Nitrite: NEGATIVE
Protein, ur: NEGATIVE mg/dL
Specific Gravity, Urine: <1.005 — ABNORMAL LOW (ref 1.005–1.030)
pH: 6 (ref 5.0–8.0)

## 2015-11-08 LAB — WET PREP, GENITAL
Clue Cells Wet Prep HPF POC: NONE SEEN
Sperm: NONE SEEN
Trich, Wet Prep: NONE SEEN
Yeast Wet Prep HPF POC: NONE SEEN

## 2015-11-08 LAB — URINE MICROSCOPIC-ADD ON

## 2015-11-08 LAB — POCT FERN TEST: POCT Fern Test: NEGATIVE

## 2015-11-08 MED ORDER — LACTATED RINGERS IV BOLUS (SEPSIS)
1000.0000 mL | Freq: Once | INTRAVENOUS | Status: AC
  Administered 2015-11-08: 1000 mL via INTRAVENOUS

## 2015-11-08 NOTE — MAU Provider Note (Signed)
MAU HISTORY AND PHYSICAL  Chief Complaint:  Rule out ROM  Kristi Anderson is a 25 y.o.  G1P0000  at [redacted]w[redacted]d presenting for No chief complaint on file. . Patient states she has been having  none contractions, none vaginal bleeding, intact, sterile speculum exam negative fern membranes, with active fetal movement.    Past Medical History:  Diagnosis Date  . Allergy    morphine and latex  . Anxiety   . Pregnant 06/01/2015  . PTSD (post-traumatic stress disorder)    had dog attack 11/15 had 4 surgeries on left ear  . PTSD (post-traumatic stress disorder)   . Vitamin D deficiency     Past Surgical History:  Procedure Laterality Date  . 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 EXTERNAL EAR SURGERY      Family History  Problem Relation Age of Onset  . Arthritis Mother   . Heart disease Mother   . Clotting disorder Father   . Heart failure Father   . Stroke Maternal Grandmother   . Heart disease Maternal Grandfather   . Early death Maternal Grandfather   . Arthritis Paternal Grandmother   . Diabetes Paternal Grandfather   . Seizures Sister     Social History  Substance Use Topics  . Smoking status: Passive Smoke Exposure - Never Smoker    Types: Cigarettes  . Smokeless tobacco: Never Used  . Alcohol use No    Allergies  Allergen Reactions  . Morphine And Related Nausea Only  . Latex Swelling    Prescriptions Prior to Admission  Medication Sig Dispense Refill Last Dose  . doxylamine, Sleep, (UNISOM) 25 MG tablet Take 1 tablet (25 mg total) by mouth at bedtime as needed. 30 tablet 5 Taking  . metoCLOPramide (REGLAN) 10 MG tablet Take 1 tablet (10 mg total) by mouth 3 (three) times daily before meals. 90 tablet 5 Taking  . metroNIDAZOLE (FLAGYL) 500 MG tablet Take 1 tablet (500 mg total) by mouth 2 (two) times daily. 14 tablet 2 Taking  . ondansetron (ZOFRAN) 4 MG tablet Reported on 08/10/2015  0 Taking  . Prenat-FeAsp-Meth-FA-DHA w/o A (PRENATE  PIXIE) 10-0.6-0.4-200 MG CAPS Take 1 capsule by mouth daily. 30 capsule 11 Taking  . pyridOXINE (VITAMIN B-6) 50 MG tablet Take 1 tablet (50 mg total) by mouth 2 (two) times daily at 8 am and 10 pm. 60 tablet 5 Taking  . ranitidine (ZANTAC) 150 MG tablet Take 1 tablet (150 mg total) by mouth 2 (two) times daily. 60 tablet 5 Taking    Review of Systems - Negative except for what is mentioned in HPI.  Physical Exam  Blood pressure 107/68, pulse 86, temperature 98.1 F (36.7 C), resp. rate 16, weight 64 kg (141 lb 3.2 oz), last menstrual period 04/23/2015. GENERAL: Well-developed, well-nourished female in no acute distress.  LUNGS: Clear to auscultation bilaterally.  HEART: Regular rate and rhythm. ABDOMEN: Soft, nontender, nondistended, gravid.  EXTREMITIES: Nontender, no edema, 2+ distal pulses. GU: sterile speculum - thick white discharge, no pooling Presentation: undetermined FHT:  Cat 1 Contractions: irregular   Labs: No results found for this or any previous visit (from the past 24 hour(s)).  Imaging Studies:  No results found.  Assessment: Kristi Anderson is  25 y.o. G1P0000 at [redacted]w[redacted]d presents with leakage of fluid.  Plan: -ROF: fern negative, not convincing story. UCs picked up, but dissipated with 1L LR bolus.  Loni Muse 8/7/20173:46 PM  OB FELLOW MAU EVALUATION  ATTESTATION  I have seen and examined this patient; I agree with above documentation in the resident's note.    Ernestina Pennaicholas Kristel Durkee 11/08/2015, 6:48 PM

## 2015-11-08 NOTE — MAU Note (Signed)
Felt some wetness, about noon.  Went to the bathroom and wiped- saw some clear fluid.

## 2015-11-09 ENCOUNTER — Encounter: Payer: 59 | Admitting: Certified Nurse Midwife

## 2015-11-09 ENCOUNTER — Ambulatory Visit (INDEPENDENT_AMBULATORY_CARE_PROVIDER_SITE_OTHER): Payer: 59 | Admitting: Certified Nurse Midwife

## 2015-11-09 VITALS — BP 112/76 | HR 77 | Temp 97.4°F | Wt 141.0 lb

## 2015-11-09 DIAGNOSIS — Z3403 Encounter for supervision of normal first pregnancy, third trimester: Secondary | ICD-10-CM

## 2015-11-09 LAB — POCT URINALYSIS DIPSTICK
Bilirubin, UA: NEGATIVE
Glucose, UA: NEGATIVE
Ketones, UA: NEGATIVE
Nitrite, UA: NEGATIVE
Protein, UA: NEGATIVE
Spec Grav, UA: <=1.005
Urobilinogen, UA: 0.2
pH, UA: 8

## 2015-11-09 LAB — OB RESULTS CONSOLE GC/CHLAMYDIA: Gonorrhea: NEGATIVE

## 2015-11-09 LAB — GC/CHLAMYDIA PROBE AMP (~~LOC~~) NOT AT ARMC
Chlamydia: NEGATIVE
Neisseria Gonorrhea: NEGATIVE

## 2015-11-09 NOTE — Progress Notes (Signed)
Subjective:    Pricilla Rifflelizabeth Budnick is a 25 y.o. female being seen today for her obstetrical visit. She is at 4072w4d gestation. Patient reports no complaints. Fetal movement: normal.  Was seen 11/08/15 for dehydration.  States that she is feeling better today.  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  Was out yesterday for dehydration.    Problem List Items Addressed This Visit    None    Visit Diagnoses    Encounter for supervision of normal first pregnancy in third trimester    -  Primary     Patient Active Problem List   Diagnosis Date Noted  . Pregnant 06/01/2015  . Vitamin D deficiency 05/03/2015  . Post traumatic stress disorder 11/18/2014  . Counseling for initiation of birth control method 11/18/2014   Objective:    BP 112/76   Pulse 77   Temp 97.4 F (36.3 C)   Wt 141 lb (64 kg)   LMP 04/23/2015 (Approximate)   BMI 25.79 kg/m  FHT:  155 BPM  Uterine Size: size equals dates  Presentation: cephalic     Assessment:    Pregnancy @ 5872w4d weeks   Recent episode of dehydration  Normal discomforts of pregnancy   Plan:     labs reviewed, problem list updated Consent signed. GBS planning TDAP offered  Rhogam given for RH negative Pediatrician: discussed. Infant feeding: plans to breastfeed. Maternity leave: discussed. Cigarette smoking: never smoked. No orders of the defined types were placed in this encounter.  No orders of the defined types were placed in this encounter.  Follow up in 2 Weeks.

## 2015-11-09 NOTE — Addendum Note (Signed)
Addended by: Lear NgMARTIN, Nyara Capell L on: 11/09/2015 03:09 PM   Modules accepted: Orders

## 2015-11-23 ENCOUNTER — Encounter: Payer: Self-pay | Admitting: Certified Nurse Midwife

## 2015-11-23 ENCOUNTER — Ambulatory Visit (INDEPENDENT_AMBULATORY_CARE_PROVIDER_SITE_OTHER): Payer: 59 | Admitting: Certified Nurse Midwife

## 2015-11-23 VITALS — BP 118/77 | HR 97 | Wt 146.0 lb

## 2015-11-23 DIAGNOSIS — O360130 Maternal care for anti-D [Rh] antibodies, third trimester, not applicable or unspecified: Secondary | ICD-10-CM | POA: Diagnosis not present

## 2015-11-23 DIAGNOSIS — Z3403 Encounter for supervision of normal first pregnancy, third trimester: Secondary | ICD-10-CM

## 2015-11-23 DIAGNOSIS — Z3400 Encounter for supervision of normal first pregnancy, unspecified trimester: Secondary | ICD-10-CM | POA: Insufficient documentation

## 2015-11-23 LAB — POCT URINALYSIS DIPSTICK
Bilirubin, UA: NEGATIVE
Blood, UA: 50
Glucose, UA: 50
Ketones, UA: NEGATIVE
Nitrite, UA: NEGATIVE
Spec Grav, UA: 1.025
Urobilinogen, UA: NEGATIVE
pH, UA: 5

## 2015-11-23 MED ORDER — RHO D IMMUNE GLOBULIN 1500 UNIT/2ML IJ SOSY
300.0000 ug | PREFILLED_SYRINGE | Freq: Once | INTRAMUSCULAR | Status: AC
  Administered 2015-11-23: 300 ug via INTRAMUSCULAR

## 2015-11-23 NOTE — Progress Notes (Signed)
Subjective:    Pricilla Rifflelizabeth Braid is a 25 y.o. female being seen today for her obstetrical visit. She is at 532w4d gestation. Patient reports heartburn, no bleeding, no contractions, no cramping, no leaking and chest tightness, denies SOB or anxiety attacks.  Spouse states that she is having anxiety issues. Denies palpitations, dizziness. Fetal movement: normal.  Problem List Items Addressed This Visit      Other   Supervision of normal first pregnancy - Primary   Relevant Medications   rho (d) immune globulin (RHIG/RHOPHYLAC) injection 300 mcg (Completed)   Other Relevant Orders   POCT urinalysis dipstick (Completed)   Culture, OB Urine    Other Visit Diagnoses    Rh negative state in antepartum period, third trimester, not applicable or unspecified fetus       Relevant Medications   rho (d) immune globulin (RHIG/RHOPHYLAC) injection 300 mcg (Completed)     Patient Active Problem List   Diagnosis Date Noted  . Supervision of normal first pregnancy 11/23/2015  . Pregnant 06/01/2015  . Vitamin D deficiency 05/03/2015  . Post traumatic stress disorder 11/18/2014  . Counseling for initiation of birth control method 11/18/2014   Objective:    BP 118/77   Pulse 97   Wt 146 lb (66.2 kg)   LMP 04/23/2015 (Approximate)   BMI 26.70 kg/m  FHT:  142 BPM  Uterine Size: 31 cm and size equals dates  Presentation: cephalic   Cardiac: RRR, S1&S2 present, no murmurs, rubs or gallops  Assessment:    Pregnancy @ 692w4d weeks   ?anxiety  GERD   RH- status: rhogam given today  Plan:     labs reviewed, problem list updated Consent signed. GBS planning TDAP offered  Rhogam given for RH negative Pediatrician: discussed. Infant feeding: plans to breastfeed. Maternity leave: discussed. Cigarette smoking: quit at start of pregnancy. Orders Placed This Encounter  Procedures  . Culture, OB Urine  . POCT urinalysis dipstick   Meds ordered this encounter  Medications  . rho (d) immune  globulin (RHIG/RHOPHYLAC) injection 300 mcg   Follow up in 2 Weeks.

## 2015-11-23 NOTE — Progress Notes (Signed)
Pt complains today of heartburn and chest tightness. Pt received Rhogam injection at today's visit. Pt tolerated well.

## 2015-11-25 LAB — CULTURE, OB URINE

## 2015-11-25 LAB — URINE CULTURE, OB REFLEX

## 2015-11-26 ENCOUNTER — Other Ambulatory Visit: Payer: Self-pay | Admitting: Certified Nurse Midwife

## 2015-11-26 DIAGNOSIS — O2343 Unspecified infection of urinary tract in pregnancy, third trimester: Secondary | ICD-10-CM

## 2015-11-26 MED ORDER — NITROFURANTOIN MONOHYD MACRO 100 MG PO CAPS: 100.0000 mg | ORAL_CAPSULE | Freq: Two times a day (BID) | ORAL | 0 refills | Status: AC

## 2015-12-01 ENCOUNTER — Telehealth: Payer: Self-pay | Admitting: *Deleted

## 2015-12-01 NOTE — Telephone Encounter (Signed)
Patient made aware of lab results and antibiotics called to her pharmacy.Explained to patient needs to drink at least 6-8 8 oz glasses of water daily.

## 2015-12-09 ENCOUNTER — Ambulatory Visit (INDEPENDENT_AMBULATORY_CARE_PROVIDER_SITE_OTHER): Payer: 59 | Admitting: Certified Nurse Midwife

## 2015-12-09 VITALS — BP 134/88 | HR 102 | Temp 98.2°F | Wt 156.0 lb

## 2015-12-09 DIAGNOSIS — Z3403 Encounter for supervision of normal first pregnancy, third trimester: Secondary | ICD-10-CM

## 2015-12-09 NOTE — Patient Instructions (Addendum)
How a Baby Grows During Pregnancy Pregnancy begins when a female's sperm enters a female's egg (fertilization). This happens in one of the tubes (fallopian tubes) that connect the ovaries to the womb (uterus). The fertilized egg is called an embryo until it reaches 10 weeks. From 10 weeks until birth, it is called a fetus. The fertilized egg moves down the fallopian tube to the uterus. Then it implants into the lining of the uterus and begins to grow. The developing fetus receives oxygen and nutrients through the pregnant woman's bloodstream and the tissues that grow (placenta) to support the fetus. The placenta is the life support system for the fetus. It provides nutrition and removes waste. Learning as much as you can about your pregnancy and how your baby is developing can help you enjoy the experience. It can also make you aware of when there might be a problem and when to ask questions. HOW LONG DOES A TYPICAL PREGNANCY LAST? A pregnancy usually lasts 280 days, or about 40 weeks. Pregnancy is divided into three trimesters:  First trimester: 0-13 weeks.  Second trimester: 14-27 weeks.  Third trimester: 28-40 weeks. The day when your baby is considered ready to be born (full term) is your estimated date of delivery. HOW DOES MY BABY DEVELOP MONTH BY MONTH? First month  The fertilized egg attaches to the inside of the uterus.  Some cells will form the placenta. Others will form the fetus.  The arms, legs, brain, spinal cord, lungs, and heart begin to develop.  At the end of the first month, the heart begins to beat. Second month  The bones, inner ear, eyelids, hands, and feet form.  The genitals develop.  By the end of 8 weeks, all major organs are developing. Third month  All of the internal organs are forming.  Teeth develop below the gums.  Bones and muscles begin to grow. The spine can flex.  The skin is transparent.  Fingernails and toenails begin to form.  Arms and  legs continue to grow longer, and hands and feet develop.  The fetus is about 3 in (7.6 cm) long. Fourth month  The placenta is completely formed.  The external sex organs, neck, outer ear, eyebrows, eyelids, and fingernails are formed.  The fetus can hear, swallow, and move its arms and legs.  The kidneys begin to produce urine.  The skin is covered with a white waxy coating (vernix) and very fine hair (lanugo). Fifth month  The fetus moves around more and can be felt for the first time (quickening).  The fetus starts to sleep and wake up and may begin to suck its finger.  The nails grow to the end of the fingers.  The organ in the digestive system that makes bile (gallbladder) functions and helps to digest the nutrients.  If your baby is a girl, eggs are present in her ovaries. If your baby is a boy, testicles start to move down into his scrotum. Sixth month  The lungs are formed, but the fetus is not yet able to breathe.  The eyes open. The brain continues to develop.  Your baby has fingerprints and toe prints. Your baby's hair grows thicker.  At the end of the second trimester, the fetus is about 9 in (22.9 cm) long. Seventh month  The fetus kicks and stretches.  The eyes are developed enough to sense changes in light.  The hands can make a grasping motion.  The fetus responds to sound. Eighth month  All organs and body systems are fully developed and functioning.  Bones harden and taste buds develop. The fetus may hiccup.  Certain areas of the brain are still developing. The skull remains soft. Ninth month  The fetus gains about  lb (0.23 kg) each week.  The lungs are fully developed.  Patterns of sleep develop.  The fetus's head typically moves into a head-down position (vertex) in the uterus to prepare for birth. If the buttocks move into a vertex position instead, the baby is breech.  The fetus weighs 6-9 lbs (2.72-4.08 kg) and is 19-20 in  (48.26-50.8 cm) long. WHAT CAN I DO TO HAVE A HEALTHY PREGNANCY AND HELP MY BABY DEVELOP? Eating and Drinking  Eat a healthy diet.  Talk with your health care provider to make sure that you are getting the nutrients that you and your baby need.  Visit www.BuildDNA.es to learn about creating a healthy diet.  Gain a healthy amount of weight during pregnancy as advised by your health care provider. This is usually 25-35 pounds. You may need to:  Gain more if you were underweight before getting pregnant or if you are pregnant with more than one baby.  Gain less if you were overweight or obese when you got pregnant. Medicines and Vitamins  Take prenatal vitamins as directed by your health care provider. These include vitamins such as folic acid, iron, calcium, and vitamin D. They are important for healthy development.  Take medicines only as directed by your health care provider. Read labels and ask a pharmacist or your health care provider whether over-the-counter medicines, supplements, and prescription drugs are safe to take during pregnancy. Activities  Be physically active as advised by your health care provider. Ask your health care provider to recommend activities that are safe for you to do, such as walking or swimming.  Do not participate in strenuous or extreme sports. Lifestyle  Do not drink alcohol.  Do not use any tobacco products, including cigarettes, chewing tobacco, or electronic cigarettes. If you need help quitting, ask your health care provider.  Do not use illegal drugs. Safety  Avoid exposure to mercury, lead, or other heavy metals. Ask your health care provider about common sources of these heavy metals.  Avoid listeria infection during pregnancy. Follow these precautions:  Do not eat soft cheeses or deli meats.  Do not eat hot dogs unless they have been warmed up to the point of steaming, such as in the microwave oven.  Do not drink unpasteurized  milk.  Avoid toxoplasmosis infection during pregnancy. Follow these precautions:  Do not change your cat's litter box, if you have a cat. Ask someone else to do this for you.  Wear gardening gloves while working in the yard. General Instructions  Keep all follow-up visits as directed by your health care provider. This is important. This includes prenatal care and screening tests.  Manage any chronic health conditions. Work closely with your health care provider to keep conditions, such as diabetes, under control. HOW DO I KNOW IF MY BABY IS DEVELOPING WELL? At each prenatal visit, your health care provider will do several different tests to check on your health and keep track of your baby's development. These include:  Fundal height.  Your health care provider will measure your growing belly from top to bottom using a tape measure.  Your health care provider will also feel your belly to determine your baby's position.  Heartbeat.  An ultrasound in the first trimester can confirm  pregnancy and show a heartbeat, depending on how far along you are.  Your health care provider will check your baby's heart rate at every prenatal visit.  As you get closer to your delivery date, you may have regular fetal heart rate monitoring to make sure that your baby is not in distress.  Second trimester ultrasound.  This ultrasound checks your baby's development. It also indicates your baby's gender. WHAT SHOULD I DO IF I HAVE CONCERNS ABOUT MY BABY'S DEVELOPMENT? Always talk with your health care provider about any concerns that you may have.   This information is not intended to replace advice given to you by your health care provider. Make sure you discuss any questions you have with your health care provider.   Document Released: 09/06/2007 Document Revised: 12/09/2014 Document Reviewed: 08/27/2013 Elsevier Interactive Patient Education 2016 [employer]  Pain Relief During Labor and  Delivery Everyone experiences pain differently, but labor causes severe pain for many women. The amount of pain you experience during labor and delivery depends on your pain tolerance, contraction strength, and your baby's size and position. There are many ways to prepare for and deal with the pain, including:   Taking prenatal classes to learn about labor and delivery. The more informed you are, the less anxious and afraid you may be. This can help lessen the pain.  Taking pain-relieving medicine during labor and delivery.  Learning breathing and relaxation techniques.  Taking a shower or bath.  Getting massaged.  Changing positions.  Placing an ice pack on your back. Discuss your pain control options with your health care provider during your prenatal visits.  WHAT ARE THE TWO TYPES OF PAIN-RELIEVING MEDICINES? 1. Analgesics. These are medicines that decrease pain without total loss of feeling or muscle movement. 2. Anesthetics. These are medicines that block all feeling, including pain. There can be minor side effects of both types, such as nausea, trouble concentrating, becoming sleepy, and lowering the heart rate of the baby. However, health care providers are careful to give doses that will not seriously affect the baby.  WHAT ARE THE SPECIFIC TYPES OF ANALGESICS AND ANESTHETICS? Systemic Analgesic Systemic pain medicines affect your whole body rather than focusing pain relief on the area of your body experiencing pain. This type of medicine is given either through an IV tube in your vein or by a shot (injection) into your muscle. This medicine will lessen your pain but will not stop it completely. It may also make you sleepy, but it will not make you lose consciousness.  Local Anesthetic Local anesthetic isused tonumb a small area of your body. The medicine is injected into the area of nerves that carry feeling to the vagina, vulva, or the area between the vagina and anus  (perineum).  General Anesthetic This type of medicine causes you to lose consciousness so you do not feel pain. It is usually used only in emergency situations during labor. It is given through an IV tube or face mask. Paracervical Block A paracervical block is a form of local anesthesia given during labor. Numbing medicine is injected into the right and left sides of the cervix and vagina. It helps to lessen the pain caused by contractions and stretching of the cervix. It may have to be given more than once.  Pudendal Block A pudendal block is another form of local anesthesia. It is used to relieve the pain associated with pushing or stretching of the perineum at the time of delivery. An injection is  given deep through the vaginal wall into the pudendal nerve in the pelvis, numbing the perineum.  Epidural Anesthetic An epidural is an injection of numbing medicine given in the lower back and into the epidural space near your spinal cord. The epidural numbs the lower half of your body. You may be able to move your legs but will not be allowed to walk. Epidurals can be used for labor, delivery, or cesarean deliveries.  To prevent the medicine from wearing off, a small tube (catheter) may be threaded into the epidural space and taped in place to prevent it from slipping out. Medicine can then be given continuously in small doses through the tube until you deliver. Spinal Block A spinal block is similar to an epidural, but the medicine is injected into the spinal fluid, not the epidural space. A spinal block is only given once. It starts to relieve pain quickly but lasts only 1-2 hours. Spinal blocks can also be used for cesarean deliveries.  Combined Spinal-Epidural Block Combined spinal-epidural blocks combine the benefits of both the spinal and epidural blocks. The spinal part acts quickly to relieve pain and the epidural provides continuous pain relief. Hydrotherapy Immersion in warm water during  labor may provide comfort and relaxation. It may also help to lessen pain, the use of anesthesia, and the length of labor. However, immersion in water during the delivery (water birth) may have some risk involved and studies to determine safety and risks are ongoing. If you are a healthy woman who is expecting an uncomplicated birth, talk with your health care provider to see if water birth is an option for you.    This information is not intended to replace advice given to you by your health care provider. Make sure you discuss any questions you have with your health care provider.   Document Released: 07/06/2008 Document Revised: 03/25/2013 Document Reviewed: 08/08/2012 Elsevier Interactive Patient Education 2016 [employer]. Preterm Labor Information Preterm labor is when labor starts at less than 37 weeks of pregnancy. The normal length of a pregnancy is 39 to 41 weeks. CAUSES Often, there is no identifiable underlying cause as to why a woman goes into preterm labor. One of the most common known causes of preterm labor is infection. Infections of the uterus, cervix, vagina, amniotic sac, bladder, kidney, or even the lungs (pneumonia) can cause labor to start. Other suspected causes of preterm labor include:   Urogenital infections, such as yeast infections and bacterial vaginosis.   Uterine abnormalities (uterine shape, uterine septum, fibroids, or bleeding from the placenta).   A cervix that has been operated on (it may fail to stay closed).   Malformations in the fetus.   Multiple gestations (twins, triplets, and so on).   Breakage of the amniotic sac.  RISK FACTORS  Having a previous history of preterm labor.   Having premature rupture of membranes (PROM).   Having a placenta that covers the opening of the cervix (placenta previa).   Having a placenta that separates from the uterus (placental abruption).   Having a cervix that is too weak to hold the fetus in the  uterus (incompetent cervix).   Having too much fluid in the amniotic sac (polyhydramnios).   Taking illegal drugs or smoking while pregnant.   Not gaining enough weight while pregnant.   Being younger than 59 and older than 27 years old.   Having a low socioeconomic status.   Being African American. SYMPTOMS Signs and symptoms of preterm labor include:  Menstrual-like cramps, abdominal pain, or back pain.  Uterine contractions that are regular, as frequent as six in an hour, regardless of their intensity (may be mild or painful).  Contractions that start on the top of the uterus and spread down to the lower abdomen and back.   A sense of increased pelvic pressure.   A watery or bloody mucus discharge that comes from the vagina.  TREATMENT Depending on the length of the pregnancy and other circumstances, your health care provider may suggest bed rest. If necessary, there are medicines that can be given to stop contractions and to mature the fetal lungs. If labor happens before 34 weeks of pregnancy, a prolonged hospital stay may be recommended. Treatment depends on the condition of both you and the fetus.  WHAT SHOULD YOU DO IF YOU THINK YOU ARE IN PRETERM LABOR? Call your health care provider right away. You will need to go to the hospital to get checked immediately. HOW CAN YOU PREVENT PRETERM LABOR IN FUTURE PREGNANCIES? You should:   Stop smoking if you smoke.  Maintain healthy weight gain and avoid chemicals and drugs that are not necessary.  Be watchful for any type of infection.  Inform your health care provider if you have a known history of preterm labor.   This information is not intended to replace advice given to you by your health care provider. Make sure you discuss any questions you have with your health care provider.   Document Released: 06/10/2003 Document Revised: 11/20/2012 Document Reviewed: 04/22/2012 Elsevier Interactive Patient Education  [employer] Third Trimester of Pregnancy The third trimester is from week 29 through week 42, months 7 through 9. The third trimester is a time when the fetus is growing rapidly. At the end of the ninth month, the fetus is about 20 inches in length and weighs 6-10 pounds.  BODY CHANGES Your body goes through many changes during pregnancy. The changes vary from woman to woman.   Your weight will continue to increase. You can expect to gain 25-35 pounds (11-16 kg) by the end of the pregnancy.  You may begin to get stretch marks on your hips, abdomen, and breasts.  You may urinate more often because the fetus is moving lower into your pelvis and pressing on your bladder.  You may develop or continue to have heartburn as a result of your pregnancy.  You may develop constipation because certain hormones are causing the muscles that push waste through your intestines to slow down.  You may develop hemorrhoids or swollen, bulging veins (varicose veins).  You may have pelvic pain because of the weight gain and pregnancy hormones relaxing your joints between the bones in your pelvis. Backaches may result from overexertion of the muscles supporting your posture.  You may have changes in your hair. These can include thickening of your hair, rapid growth, and changes in texture. Some women also have hair loss during or after pregnancy, or hair that feels dry or thin. Your hair will most likely return to normal after your baby is born.  Your breasts will continue to grow and be tender. A yellow discharge may leak from your breasts called colostrum.  Your belly button may stick out.  You may feel short of breath because of your expanding uterus.  You may notice the fetus "dropping," or moving lower in your abdomen.  You may have a bloody mucus discharge. This usually occurs a few days to a week before labor begins.  Your  cervix becomes thin and soft (effaced) near your due date. WHAT TO  EXPECT AT YOUR PRENATAL EXAMS  You will have prenatal exams every 2 weeks until week 36. Then, you will have weekly prenatal exams. During a routine prenatal visit:  You will be weighed to make sure you and the fetus are growing normally.  Your blood pressure is taken.  Your abdomen will be measured to track your baby's growth.  The fetal heartbeat will be listened to.  Any test results from the previous visit will be discussed.  You may have a cervical check near your due date to see if you have effaced. At around 36 weeks, your caregiver will check your cervix. At the same time, your caregiver will also perform a test on the secretions of the vaginal tissue. This test is to determine if a type of bacteria, Group B streptococcus, is present. Your caregiver will explain this further. Your caregiver may ask you:  What your birth plan is.  How you are feeling.  If you are feeling the baby move.  If you have had any abnormal symptoms, such as leaking fluid, bleeding, severe headaches, or abdominal cramping.  If you are using any tobacco products, including cigarettes, chewing tobacco, and electronic cigarettes.  If you have any questions. Other tests or screenings that may be performed during your third trimester include:  Blood tests that check for low iron levels (anemia).  Fetal testing to check the health, activity level, and growth of the fetus. Testing is done if you have certain medical conditions or if there are problems during the pregnancy.  HIV (human immunodeficiency virus) testing. If you are at high risk, you may be screened for HIV during your third trimester of pregnancy. FALSE LABOR You may feel small, irregular contractions that eventually go away. These are called Braxton Hicks contractions, or false labor. Contractions may last for hours, days, or even weeks before true labor sets in. If contractions come at regular intervals, intensify, or become painful, it is  best to be seen by your caregiver.  SIGNS OF LABOR   Menstrual-like cramps.  Contractions that are 5 minutes apart or less.  Contractions that start on the top of the uterus and spread down to the lower abdomen and back.  A sense of increased pelvic pressure or back pain.  A watery or bloody mucus discharge that comes from the vagina. If you have any of these signs before the 37th week of pregnancy, call your caregiver right away. You need to go to the hospital to get checked immediately. HOME CARE INSTRUCTIONS   Avoid all smoking, herbs, alcohol, and unprescribed drugs. These chemicals affect the formation and growth of the baby.  Do not use any tobacco products, including cigarettes, chewing tobacco, and electronic cigarettes. If you need help quitting, ask your health care provider. You may receive counseling support and other resources to help you quit.  Follow your caregiver's instructions regarding medicine use. There are medicines that are either safe or unsafe to take during pregnancy.  Exercise only as directed by your caregiver. Experiencing uterine cramps is a good sign to stop exercising.  Continue to eat regular, healthy meals.  Wear a good support bra for breast tenderness.  Do not use hot tubs, steam rooms, or saunas.  Wear your seat belt at all times when driving.  Avoid raw meat, uncooked cheese, cat litter boxes, and soil used by cats. These carry germs that can cause birth defects in the  baby.  Take your prenatal vitamins.  Take 1500-2000 mg of calcium daily starting at the 20th week of pregnancy until you deliver your baby.  Try taking a stool softener (if your caregiver approves) if you develop constipation. Eat more high-fiber foods, such as fresh vegetables or fruit and whole grains. Drink plenty of fluids to keep your urine clear or pale yellow.  Take warm sitz baths to soothe any pain or discomfort caused by hemorrhoids. Use hemorrhoid cream if your  caregiver approves.  If you develop varicose veins, wear support hose. Elevate your feet for 15 minutes, 3-4 times a day. Limit salt in your diet.  Avoid heavy lifting, wear low heal shoes, and practice good posture.  Rest a lot with your legs elevated if you have leg cramps or low back pain.  Visit your dentist if you have not gone during your pregnancy. Use a soft toothbrush to brush your teeth and be gentle when you floss.  A sexual relationship may be continued unless your caregiver directs you otherwise.  Do not travel far distances unless it is absolutely necessary and only with the approval of your caregiver.  Take prenatal classes to understand, practice, and ask questions about the labor and delivery.  Make a trial run to the hospital.  Pack your hospital bag.  Prepare the baby's nursery.  Continue to go to all your prenatal visits as directed by your caregiver. SEEK MEDICAL CARE IF:  You are unsure if you are in labor or if your water has broken.  You have dizziness.  You have mild pelvic cramps, pelvic pressure, or nagging pain in your abdominal area.  You have persistent nausea, vomiting, or diarrhea.  You have a bad smelling vaginal discharge.  You have pain with urination. SEEK IMMEDIATE MEDICAL CARE IF:   You have a fever.  You are leaking fluid from your vagina.  You have spotting or bleeding from your vagina.  You have severe abdominal cramping or pain.  You have rapid weight loss or gain.  You have shortness of breath with chest pain.  You notice sudden or extreme swelling of your face, hands, ankles, feet, or legs.  You have not felt your baby move in over an hour.  You have severe headaches that do not go away with medicine.  You have vision changes.   This information is not intended to replace advice given to you by your health care provider. Make sure you discuss any questions you have with your health care provider.   Document  Released: 03/14/2001 Document Revised: 04/10/2014 Document Reviewed: 05/21/2012 Elsevier Interactive Patient Education 2016 [employer].  Before Baby Comes Home Before your baby arrives it is important to:  Have all of the supplies that you will need to care for your baby.  Know where to go if there is an emergency.  Discuss the baby's arrival with other family members. WHAT SUPPLIES WILL I NEED? It is recommended that you have the following supplies: Large Items  Crib.  Crib mattress.  Rear-facing infant car seat. If possible, have a trained professional check to make sure that it is installed correctly. Feeding  6-8 bottles that are 4-5 oz in size.  6-8 nipples.  Bottle brush.  Sterilizer, or a large pan or kettle with a lid.  A way to boil and cool water.  If you will be breastfeeding:  Breast pump.  Nipple cream.  Nursing bra.  Breast pads.  Breast shields.  If you will be  formula feeding:  Formula.  Measuring cups.  Measuring spoons. Bathing  Mild baby soap and baby shampoo.  Petroleum jelly.  Soft cloth towel and washcloth.  Hooded towel.  Cotton balls.  Bath basin. Other Supplies  Rectal thermometer.  Bulb syringe.  Baby wipes or washcloths for diaper changes.  Diaper bag.  Changing pad.  Clothing, including one-piece outfits and pajamas.  Baby nail clippers.  Receiving blankets.  Mattress pad and sheets for the crib.  Night-light for the baby's room.  Baby monitor.  2 or 3 pacifiers.  Either 24-36 cloth diapers and waterproof diaper covers or a box of disposable diapers. You may need to use as many as 10-12 diapers per day. HOW DO I PREPARE FOR AN EMERGENCY? Prepare for an emergency by:  Knowing how to get to the nearest hospital.  Listing the phone numbers of your baby's health care providers near your home phone and in your cell phone. HOW DO I PREPARE MY FAMILY?  Decide how to handle visitors.  If you  have other children:  Talk with them about the baby coming home. Ask them how they feel about it.  Read a book together about being a new big brother or sister.  Find ways to let them help you prepare for the new baby.  Have someone ready to care for them while you are in the hospital.   This information is not intended to replace advice given to you by your health care provider. Make sure you discuss any questions you have with your health care provider.   Document Released: 03/02/2008 Document Revised: 08/04/2014 Document Reviewed: 02/25/2014 Elsevier Interactive Patient Education [employer]

## 2015-12-09 NOTE — Progress Notes (Signed)
Patient is doing well today- she has lots of questions about labor

## 2015-12-09 NOTE — Progress Notes (Signed)
Subjective:    Pricilla Rifflelizabeth Mckiddy is a 25 y.o. female being seen today for her obstetrical visit. She is at 6540w6d gestation. Patient reports no complaints. Fetal movement: normal.  Problem List Items Addressed This Visit      Other   Supervision of normal first pregnancy - Primary    Other Visit Diagnoses   None.    Patient Active Problem List   Diagnosis Date Noted  . Supervision of normal first pregnancy 11/23/2015  . Pregnant 06/01/2015  . Vitamin D deficiency 05/03/2015  . Post traumatic stress disorder 11/18/2014  . Counseling for initiation of birth control method 11/18/2014  . Open wound of left ear 08/07/2014  . 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 Avulsion of left ear 02/10/2014  . Dog bite 02/10/2014   Objective:    LMP 04/23/2015 (Approximate)  FHT:  147 BPM  Uterine Size: 33 cm and size equals dates  Presentation: cephalic     Assessment:    Pregnancy @ 1340w6d weeks   Plan:       labs reviewed, problem list updated Consent signed. GBS planning TDAP offered  Rhogam given for RH negative Pediatrician: discussed. Infant feeding: plans to breastfeed. Maternity leave: discussed. Cigarette smoking: never smoked. No orders of the defined types were placed in this encounter.  No orders of the defined types were placed in this encounter.  Follow up in 2 Weeks.

## 2015-12-24 ENCOUNTER — Encounter: Payer: Self-pay | Admitting: Obstetrics

## 2015-12-24 ENCOUNTER — Ambulatory Visit (INDEPENDENT_AMBULATORY_CARE_PROVIDER_SITE_OTHER): Payer: 59 | Admitting: Obstetrics

## 2015-12-24 VITALS — BP 127/91 | HR 81 | Temp 98.0°F | Wt 162.5 lb

## 2015-12-24 DIAGNOSIS — Z029 Encounter for administrative examinations, unspecified: Secondary | ICD-10-CM

## 2015-12-24 DIAGNOSIS — Z3403 Encounter for supervision of normal first pregnancy, third trimester: Secondary | ICD-10-CM

## 2015-12-24 NOTE — Progress Notes (Signed)
Subjective:    Kristi Anderson is a 25 y.o. female being seen today for her obstetrical visit. She is at 5945w0d gestation. Patient reports no complaints. Fetal movement: normal.  Problem List Items Addressed This Visit    None    Visit Diagnoses   None.    Patient Active Problem List   Diagnosis Date Noted  . Supervision of normal first pregnancy 11/23/2015  . Pregnant 06/01/2015  . Vitamin D deficiency 05/03/2015  . Post traumatic stress disorder 11/18/2014  . Counseling for initiation of birth control method 11/18/2014  . Open wound of left ear 08/07/2014  . 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 Avulsion of left ear 02/10/2014  . Dog bite 02/10/2014   Objective:    BP (!) 127/91   Pulse 81   Temp 98 F (36.7 C)   Wt 162 lb 8 oz (73.7 kg)   LMP 04/23/2015 (Approximate)   BMI 29.72 kg/m  FHT:  150 BPM  Uterine Size: size equals dates  Presentation: unsure     Assessment:    Pregnancy @ 1645w0d weeks   Plan:     labs reviewed, problem list updated Consent signed. GBS sent TDAP offered  Rhogam given for RH negative Pediatrician: discussed. Infant feeding: plans to breastfeed. Maternity leave: discussed. Cigarette smoking: never smoked. No orders of the defined types were placed in this encounter.  No orders of the defined types were placed in this encounter.  Follow up in 1 Week.

## 2015-12-24 NOTE — Addendum Note (Signed)
Addended by: Marya LandryFOSTER, SUZANNE D on: 12/24/2015 11:08 AM   Modules accepted: Orders

## 2015-12-24 NOTE — Progress Notes (Signed)
Patient has question about how much baby weighs.

## 2015-12-25 LAB — OB RESULTS CONSOLE GBS: GBS: NEGATIVE

## 2015-12-26 LAB — STREP GP B NAA: Strep Gp B NAA: NEGATIVE

## 2015-12-31 ENCOUNTER — Ambulatory Visit (INDEPENDENT_AMBULATORY_CARE_PROVIDER_SITE_OTHER): Payer: 59 | Admitting: Obstetrics

## 2015-12-31 VITALS — BP 137/92 | HR 78 | Temp 97.2°F | Wt 169.6 lb

## 2015-12-31 DIAGNOSIS — Z3403 Encounter for supervision of normal first pregnancy, third trimester: Secondary | ICD-10-CM

## 2015-12-31 NOTE — Progress Notes (Signed)
Patient c/o a little bleeding last night and sharp pain on Wednesday night lower abdomen.

## 2016-01-05 ENCOUNTER — Encounter: Payer: Self-pay | Admitting: Obstetrics

## 2016-01-05 NOTE — Progress Notes (Signed)
Subjective:    Kristi Anderson is a 25 y.o. female being seen today for her obstetrical visit. She is at 1553w5d gestation. Patient reports no complaints. Fetal movement: normal.  Problem List Items Addressed This Visit    None    Visit Diagnoses   None.    Patient Active Problem List   Diagnosis Date Noted  . Supervision of normal first pregnancy 11/23/2015  . Pregnant 06/01/2015  . Vitamin D deficiency 05/03/2015  . Post traumatic stress disorder 11/18/2014  . Counseling for initiation of birth control method 11/18/2014  . Open wound of left ear 08/07/2014  . 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 Avulsion of left ear 02/10/2014  . Dog bite 02/10/2014    Objective:    BP (!) 137/92   Pulse 78   Temp 97.2 F (36.2 C)   Wt 169 lb 9.6 oz (76.9 kg)   LMP 04/23/2015 (Approximate)   BMI 31.02 kg/m  FHT: 150 BPM  Uterine Size: size equals dates  Presentations: cephalic   Assessment:    Pregnancy @ 4053w5d weeks   Plan:   Plans for delivery: Vaginal anticipated; labs reviewed; problem list updated Counseling: Consent signed. Infant feeding: plans to breastfeed. Cigarette smoking: never smoked. L&D discussion: symptoms of labor, discussed when to call, discussed what number to call, anesthetic/analgesic options reviewed and delivering clinician:  plans no preference. Postpartum supports and preparation: circumcision discussed and contraception plans discussed.  Follow up in 1 Week.

## 2016-01-10 ENCOUNTER — Ambulatory Visit (INDEPENDENT_AMBULATORY_CARE_PROVIDER_SITE_OTHER): Payer: 59 | Admitting: Certified Nurse Midwife

## 2016-01-10 ENCOUNTER — Encounter (HOSPITAL_COMMUNITY): Payer: Self-pay

## 2016-01-10 ENCOUNTER — Inpatient Hospital Stay (HOSPITAL_COMMUNITY)
Admission: AD | Admit: 2016-01-10 | Discharge: 2016-01-14 | DRG: 775 | Disposition: A | Discharge status: Home / Self Care | Payer: 59 | Source: Ambulatory Visit | Attending: Obstetrics and Gynecology | Admitting: Obstetrics and Gynecology

## 2016-01-10 VITALS — BP 161/113 | HR 92 | Temp 82.0°F | Wt 170.0 lb

## 2016-01-10 DIAGNOSIS — Z6831 Body mass index (BMI) 31.0-31.9, adult: Secondary | ICD-10-CM

## 2016-01-10 DIAGNOSIS — Z823 Family history of stroke: Secondary | ICD-10-CM | POA: Diagnosis not present

## 2016-01-10 DIAGNOSIS — O134 Gestational [pregnancy-induced] hypertension without significant proteinuria, complicating childbirth: Secondary | ICD-10-CM | POA: Diagnosis present

## 2016-01-10 DIAGNOSIS — Z3A38 38 weeks gestation of pregnancy: Secondary | ICD-10-CM | POA: Diagnosis not present

## 2016-01-10 DIAGNOSIS — O99344 Other mental disorders complicating childbirth: Secondary | ICD-10-CM | POA: Diagnosis present

## 2016-01-10 DIAGNOSIS — O1414 Severe pre-eclampsia complicating childbirth: Principal | ICD-10-CM | POA: Diagnosis present

## 2016-01-10 DIAGNOSIS — Z885 Allergy status to narcotic agent status: Secondary | ICD-10-CM | POA: Diagnosis not present

## 2016-01-10 DIAGNOSIS — O114 Pre-existing hypertension with pre-eclampsia, complicating childbirth: Secondary | ICD-10-CM | POA: Diagnosis not present

## 2016-01-10 DIAGNOSIS — O133 Gestational [pregnancy-induced] hypertension without significant proteinuria, third trimester: Secondary | ICD-10-CM

## 2016-01-10 DIAGNOSIS — Z3403 Encounter for supervision of normal first pregnancy, third trimester: Secondary | ICD-10-CM

## 2016-01-10 DIAGNOSIS — Z833 Family history of diabetes mellitus: Secondary | ICD-10-CM

## 2016-01-10 DIAGNOSIS — Z832 Family history of diseases of the blood and blood-forming organs and certain disorders involving the immune mechanism: Secondary | ICD-10-CM

## 2016-01-10 DIAGNOSIS — Z6791 Unspecified blood type, Rh negative: Secondary | ICD-10-CM

## 2016-01-10 DIAGNOSIS — O163 Unspecified maternal hypertension, third trimester: Secondary | ICD-10-CM

## 2016-01-10 DIAGNOSIS — Z7722 Contact with and (suspected) exposure to environmental tobacco smoke (acute) (chronic): Secondary | ICD-10-CM | POA: Diagnosis present

## 2016-01-10 DIAGNOSIS — Z91040 Latex allergy status: Secondary | ICD-10-CM

## 2016-01-10 DIAGNOSIS — Z8261 Family history of arthritis: Secondary | ICD-10-CM | POA: Diagnosis not present

## 2016-01-10 DIAGNOSIS — Z8249 Family history of ischemic heart disease and other diseases of the circulatory system: Secondary | ICD-10-CM

## 2016-01-10 DIAGNOSIS — O26893 Other specified pregnancy related conditions, third trimester: Secondary | ICD-10-CM | POA: Diagnosis present

## 2016-01-10 DIAGNOSIS — O1410 Severe pre-eclampsia, unspecified trimester: Secondary | ICD-10-CM | POA: Diagnosis present

## 2016-01-10 HISTORY — DX: Severe pre-eclampsia, unspecified trimester: O14.10

## 2016-01-10 LAB — COMPREHENSIVE METABOLIC PANEL
ALT: 18 U/L (ref 14–54)
AST: 26 U/L (ref 15–41)
Albumin: 2.5 g/dL — ABNORMAL LOW (ref 3.5–5.0)
Alkaline Phosphatase: 96 U/L (ref 38–126)
Anion gap: 9 (ref 5–15)
BUN: 10 mg/dL (ref 6–20)
CO2: 21 mmol/L — ABNORMAL LOW (ref 22–32)
Calcium: 8.6 mg/dL — ABNORMAL LOW (ref 8.9–10.3)
Chloride: 105 mmol/L (ref 101–111)
Creatinine, Ser: 0.61 mg/dL (ref 0.44–1.00)
GFR calc Af Amer: >60 mL/min (ref >60)
GFR calc non Af Amer: >60 mL/min (ref >60)
Glucose, Bld: 75 mg/dL (ref 65–99)
Potassium: 4.2 mmol/L (ref 3.5–5.1)
Sodium: 135 mmol/L (ref 135–145)
Total Bilirubin: 0.7 mg/dL (ref 0.3–1.2)
Total Protein: 5.7 g/dL — ABNORMAL LOW (ref 6.5–8.1)

## 2016-01-10 LAB — CBC
HCT: 39.8 % (ref 36.0–46.0)
Hemoglobin: 14 g/dL (ref 12.0–15.0)
MCH: 32 pg (ref 26.0–34.0)
MCHC: 35.2 g/dL (ref 30.0–36.0)
MCV: 90.9 fL (ref 78.0–100.0)
Platelets: 239 10*3/uL (ref 150–400)
RBC: 4.38 MIL/uL (ref 3.87–5.11)
RDW: 13.8 % (ref 11.5–15.5)
WBC: 12.9 10*3/uL — ABNORMAL HIGH (ref 4.0–10.5)

## 2016-01-10 LAB — URINALYSIS, ROUTINE W REFLEX MICROSCOPIC
Bilirubin Urine: NEGATIVE
Glucose, UA: NEGATIVE mg/dL
Ketones, ur: NEGATIVE mg/dL
Leukocytes, UA: NEGATIVE
Nitrite: NEGATIVE
Protein, ur: >300 mg/dL — AB
Specific Gravity, Urine: 1.02 (ref 1.005–1.030)
pH: 6.5 (ref 5.0–8.0)

## 2016-01-10 LAB — URINE MICROSCOPIC-ADD ON: Bacteria, UA: NONE SEEN

## 2016-01-10 LAB — PROTEIN / CREATININE RATIO, URINE
Creatinine, Urine: 164 mg/dL
Protein Creatinine Ratio: 9.42 mg/mg{Cre} — ABNORMAL HIGH (ref 0.00–0.15)
Total Protein, Urine: 1545 mg/dL

## 2016-01-10 MED ORDER — OXYTOCIN 40 UNITS IN LACTATED RINGERS INFUSION - SIMPLE MED
2.5000 [IU]/h | INTRAVENOUS | Status: DC
  Filled 2016-01-10: qty 1000

## 2016-01-10 MED ORDER — ONDANSETRON HCL 4 MG/2ML IJ SOLN
4.0000 mg | Freq: Four times a day (QID) | INTRAMUSCULAR | Status: DC | PRN
  Administered 2016-01-11: 4 mg via INTRAVENOUS
  Filled 2016-01-10: qty 2

## 2016-01-10 MED ORDER — METOCLOPRAMIDE HCL 10 MG PO TABS
5.0000 mg | ORAL_TABLET | Freq: Three times a day (TID) | ORAL | Status: DC | PRN
  Administered 2016-01-10: 5 mg via ORAL
  Filled 2016-01-10: qty 1

## 2016-01-10 MED ORDER — SOD CITRATE-CITRIC ACID 500-334 MG/5ML PO SOLN: 30.0000 mL | ORAL | Status: DC | PRN

## 2016-01-10 MED ORDER — ACETAMINOPHEN 325 MG PO TABS
650.0000 mg | ORAL_TABLET | ORAL | Status: DC | PRN
  Administered 2016-01-10 – 2016-01-11 (×2): 650 mg via ORAL
  Filled 2016-01-10 (×3): qty 2

## 2016-01-10 MED ORDER — LIDOCAINE HCL (PF) 1 % IJ SOLN
30.0000 mL | INTRAMUSCULAR | Status: AC | PRN
  Administered 2016-01-11: 30 mL via SUBCUTANEOUS
  Filled 2016-01-10: qty 30

## 2016-01-10 MED ORDER — TERBUTALINE SULFATE 1 MG/ML IJ SOLN: 0.2500 mg | Freq: Once | INTRAMUSCULAR | Status: DC | PRN

## 2016-01-10 MED ORDER — PROMETHAZINE HCL 25 MG/ML IJ SOLN
12.5000 mg | Freq: Four times a day (QID) | INTRAMUSCULAR | Status: DC | PRN
  Administered 2016-01-10: 12.5 mg via INTRAVENOUS
  Filled 2016-01-10: qty 1

## 2016-01-10 MED ORDER — HYDRALAZINE HCL 20 MG/ML IJ SOLN: 10.0000 mg | Freq: Once | INTRAMUSCULAR | Status: DC | PRN

## 2016-01-10 MED ORDER — OXYCODONE-ACETAMINOPHEN 5-325 MG PO TABS: 2.0000 | ORAL_TABLET | ORAL | Status: DC | PRN

## 2016-01-10 MED ORDER — MAGNESIUM SULFATE BOLUS VIA INFUSION
4.0000 g | Freq: Once | INTRAVENOUS | Status: AC
  Administered 2016-01-10: 4 g via INTRAVENOUS
  Filled 2016-01-10: qty 500

## 2016-01-10 MED ORDER — OXYTOCIN BOLUS FROM INFUSION
500.0000 mL | Freq: Once | INTRAVENOUS | Status: AC
  Administered 2016-01-11: 500 mL via INTRAVENOUS

## 2016-01-10 MED ORDER — LACTATED RINGERS IV SOLN
INTRAVENOUS | Status: DC
  Administered 2016-01-11: 04:00:00 via INTRAVENOUS

## 2016-01-10 MED ORDER — LACTATED RINGERS IV SOLN: 500.0000 mL | INTRAVENOUS | Status: DC | PRN

## 2016-01-10 MED ORDER — FLEET ENEMA 7-19 GM/118ML RE ENEM: 1.0000 | ENEMA | RECTAL | Status: DC | PRN

## 2016-01-10 MED ORDER — MISOPROSTOL 25 MCG QUARTER TABLET
25.0000 ug | ORAL_TABLET | ORAL | Status: DC | PRN
  Administered 2016-01-10: 25 ug via VAGINAL
  Filled 2016-01-10: qty 0.25
  Filled 2016-01-10: qty 1

## 2016-01-10 MED ORDER — OXYCODONE-ACETAMINOPHEN 5-325 MG PO TABS: 1.0000 | ORAL_TABLET | ORAL | Status: DC | PRN

## 2016-01-10 MED ORDER — MAGNESIUM SULFATE 50 % IJ SOLN
2.0000 g/h | INTRAVENOUS | Status: DC
  Administered 2016-01-11: 2 g/h via INTRAVENOUS
  Filled 2016-01-10 (×2): qty 80

## 2016-01-10 MED ORDER — LABETALOL HCL 5 MG/ML IV SOLN
20.0000 mg | INTRAVENOUS | Status: AC | PRN
  Administered 2016-01-10: 20 mg via INTRAVENOUS
  Administered 2016-01-11: 40 mg via INTRAVENOUS
  Administered 2016-01-11: 20 mg via INTRAVENOUS
  Filled 2016-01-10 (×2): qty 4
  Filled 2016-01-10: qty 8
  Filled 2016-01-10: qty 4

## 2016-01-10 MED ORDER — FENTANYL CITRATE (PF) 100 MCG/2ML IJ SOLN
100.0000 ug | INTRAMUSCULAR | Status: DC | PRN
  Administered 2016-01-10 – 2016-01-11 (×5): 100 ug via INTRAVENOUS
  Filled 2016-01-10 (×5): qty 2

## 2016-01-10 NOTE — Progress Notes (Signed)
Subjective:    Kristi Anderson is a 25 y.o. female being seen today for her obstetrical visit. She is at 4920w3d gestation. Patient reports headache, no cramping, no leaking, occasional contractions and lower leg edema. Fetal movement: normal.  Problem List Items Addressed This Visit      Other   Supervision of normal first pregnancy - Primary    Other Visit Diagnoses    Elevated blood pressure complicating pregnancy in third trimester, antepartum         Patient Active Problem List   Diagnosis Date Noted  . Supervision of normal first pregnancy 11/23/2015  . Pregnant 06/01/2015  . Vitamin D deficiency 05/03/2015  . Post traumatic stress disorder 11/18/2014  . Counseling for initiation of birth control method 11/18/2014  . Open wound of left ear 08/07/2014  . 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 Avulsion of left ear 02/10/2014  . Dog bite 02/10/2014    Objective:    BP (!) 161/113   Pulse 92   Temp (!) 82 F (27.8 C)   Wt 170 lb (77.1 kg)   LMP 04/23/2015 (Approximate)   BMI 31.09 kg/m  FHT: 145 BPM  Uterine Size: 37 cm and size equals dates  Presentations: cephalic  Pelvic Exam:              Dilation: 1cm       Effacement: 25%             Station:  -3    Consistency: medium            Position: middle     Assessment:    Pregnancy @ 3720w3d weeks   Probable pre-eclampsia with proteinuria  Plan:   Sent to MAU for evaluation of elevated blood pressures, lower leg edema and HA  Plans for delivery: Vaginal anticipated; labs reviewed; problem list updated Counseling: Consent signed. Infant feeding: plans to breastfeed. Cigarette smoking: quit several weeks ago. L&D discussion: symptoms of labor, discussed when to call, discussed what number to call, anesthetic/analgesic options reviewed and delivering clinician:  plans Certified Nurse-Midwife. Postpartum supports and preparation: circumcision discussed and contraception plans discussed.  Follow up in 1  Week.

## 2016-01-10 NOTE — Anesthesia Pain Management Evaluation Note (Signed)
  [facility]NA Pain Management Visit Note  Patient: Pricilla Rifflelizabeth Swails, 25 y.o., female  "Hello I am a member of the anesthesia team at [facility]. We have an anesthesia team available at all times to provide care throughout the hospital, including epidural management and anesthesia for C-section. I don't know your plan for the delivery whether it a natural birth, water birth, IV sedation, nitrous supplementation, doula or epidural, but we want to meet your pain goals."   1.Was your pain managed to your expectations on prior hospitalizations?   No prior hospitalizations  2.What is your expectation for pain management during this hospitalization?     Epidural  3.How can we help you reach that goal?   Record the patient's initial score and the patient's pain goal.   Pain: 6  Pain Goal: 8 [facility] wants you to be able to say your pain was always managed very well.  Lakrisha Iseman Hristova 01/10/2016

## 2016-01-10 NOTE — MAU Note (Signed)
Pt sent from office for high BP.

## 2016-01-10 NOTE — Progress Notes (Signed)
LABOR PROGRESS NOTE  Pricilla Rifflelizabeth Lapenna is a 25 y.o. G1P0000 at 2559w3d  admitted for IOL for preE with severe features (BPs).  Subjective: Pt states she is doing fine. She just started having a headache and was given some Tylenol. She states her contraction pain is progressively getting worse.  Objective: BP (!) 149/95   Pulse 85   Temp 98.2 F (36.8 C) (Oral)   Resp 17   Ht 5\' 2"  (1.575 m)   Wt 77.1 kg (170 lb)   LMP 04/23/2015 (Approximate)   BMI 31.09 kg/m  or  Vitals:   01/10/16 2000 01/10/16 2015 01/10/16 2030 01/10/16 2045  BP: (!) 146/91 (!) 143/91 (!) 153/90 (!) 149/95  Pulse: 79 85 81 85  Resp:      Temp:      TempSrc:      Weight:      Height:        Dilation: 1.5 Effacement (%): 60 Station: -3 Presentation: Vertex Exam by:: Mumaw MD  Labs: Lab Results  Component Value Date   WBC 12.9 (H) 01/10/2016   HGB 14.0 01/10/2016   HCT 39.8 01/10/2016   MCV 90.9 01/10/2016   PLT 239 01/10/2016    Patient Active Problem List   Diagnosis Date Noted  . Gestational hypertension, third trimester 01/10/2016  . Supervision of normal first pregnancy 11/23/2015  . Pregnant 06/01/2015  . Vitamin D deficiency 05/03/2015  . Post traumatic stress disorder 11/18/2014  . Counseling for initiation of birth control method 11/18/2014  . Open wound of left ear 08/07/2014  . 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 Avulsion of left ear 02/10/2014  . Dog bite 02/10/2014    Assessment / Plan: 25 y.o. G1P0000 at 6459w3d here for IOL for pre-E with severe features (BPs).  Labor: Currently with Foley bulb in place. Cytotec q4hrs. Fetal Wellbeing:  Category I Pain Control:  IV pain meds. Does not want an epidural. Anticipated MOD:  SVD  Hilton SinclairKaty D Gladyse Corvin, MD 01/10/2016, 8:52 PM

## 2016-01-10 NOTE — H&P (Signed)
LABOR AND DELIVERY ADMISSION HISTORY AND PHYSICAL NOTE  Kristi Anderson is a 25 y.o. female G1P0000 with IUP at 3758w3d by LMP/1st trimester US presenting for IOL for GHTN, pre-eclampsia rule out.   Has had a normal pregnancy without any elevated pressures until her visit today. Reported a headache a few days ago that went away with tylenol. Denies scotomata, RUQ pain. Has had leg swelling for the past several weeks that is worsening. She reports positive fetal movement. She denies leakage of fluid or vaginal bleeding.  Prenatal History/Complications:  Past Medical History: Past Medical History:  Diagnosis Date  . Allergy    morphine and latex  . Anxiety   . Pregnant 06/01/2015  . PTSD (post-traumatic stress disorder)    had dog attack 11/15 had 4 surgeries on left ear  . PTSD (post-traumatic stress disorder)   . Vitamin D deficiency     Past Surgical History: Past Surgical History:  Procedure Laterality Date  . 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 EXTERNAL EAR SURGERY      Obstetrical History: OB History    Gravida Para Term Preterm AB Living   1 0 0 0 0 0   SAB TAB Ectopic Multiple Live Births   0 0 0 0        Social History: Social History   Social History  . Marital status: Married    Spouse name: N/A  . Number of children: N/A  . Years of education: N/A   Social History Main Topics  . Smoking status: Passive Smoke Exposure - Never Smoker    Types: Cigarettes  . Smokeless tobacco: Never Used  . Alcohol use No  . Drug use: No  . Sexual activity: Yes    Partners: Male    Birth control/ protection: None   Other Topics Concern  . None   Social History Narrative   ** Merged History Encounter **        Family History: Family History  Problem Relation Age of Onset  . Arthritis Mother   . Heart disease Mother   . Clotting disorder Father   . Heart failure Father   . Stroke Maternal Grandmother   . Heart disease  Maternal Grandfather   . Early death Maternal Grandfather   . Arthritis Paternal Grandmother   . Diabetes Paternal Grandfather   . Seizures Sister     Allergies: Allergies  Allergen Reactions  . Morphine And Related Nausea Only  . Latex Swelling    Prescriptions Prior to Admission  Medication Sig Dispense Refill Last Dose  . acetaminophen (TYLENOL) 500 MG tablet Take 500 mg by mouth every 6 (six) hours as needed for mild pain, moderate pain or headache.   Past Week at Unknown time  . Prenat-FeAsp-Meth-FA-DHA w/o A (PRENATE PIXIE) 10-0.6-0.4-200 MG CAPS Take 1 capsule by mouth at bedtime.   01/09/2016 at Unknown time    Review of Systems   All systems reviewed and negative except as stated in HPI  Blood pressure 133/95, pulse 100, temperature 98.1 F (36.7 C), resp. rate 16, height 5\' 2"  (1.575 m), weight 77.1 kg (170 lb), last menstrual period 04/23/2015. General appearance: alert, cooperative and no distress Lungs: no respiratory distress Heart: regular rate Abdomen: soft, non-tender Extremities: No calf swelling or tenderness Fetal monitoring: baseline heart rate 140's, good variability, accelerations present no decelerations Uterine activity: no contractions by toco   Prenatal labs: ABO, Rh: A/Negative/-- (04/11 1516) Antibody: Negative (04/11  1516) Rubella: immune RPR: Non Reactive (07/31 1006)  HBsAg: Negative (04/11 1516)  HIV: Non Reactive (07/31 1006)  GBS: Negative (09/22 1111)   Prenatal Transfer Tool  Maternal Diabetes: No Genetic Screening: Normal Maternal Ultrasounds/Referrals: Normal Fetal Ultrasounds or other Referrals:  Referred to Materal Fetal Medicine  Maternal Substance Abuse:  No Significant Maternal Medications:  None Significant Maternal Lab Results: None  Results for orders placed or performed during the hospital encounter of 01/10/16 (from the past 24 hour(s))  CBC   Collection Time: 01/10/16  3:11 PM  Result Value Ref Range   WBC 12.9  (H) 4.0 - 10.5 K/uL   RBC 4.38 3.87 - 5.11 MIL/uL   Hemoglobin 14.0 12.0 - 15.0 g/dL   HCT 04.5 40.9 - 81.1 %   MCV 90.9 78.0 - 100.0 fL   MCH 32.0 26.0 - 34.0 pg   MCHC 35.2 30.0 - 36.0 g/dL   RDW 91.4 78.2 - 95.6 %   Platelets 239 150 - 400 K/uL    Patient Active Problem List   Diagnosis Date Noted  . Supervision of normal first pregnancy 11/23/2015  . Pregnant 06/01/2015  . Vitamin D deficiency 05/03/2015  . Post traumatic stress disorder 11/18/2014  . Counseling for initiation of birth control method 11/18/2014  . Open wound of left ear 08/07/2014  . 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 Avulsion of left ear 02/10/2014  . Dog bite 02/10/2014    Assessment: Kristi Anderson is a 25 y.o. G1P0000 at [redacted]w[redacted]d here for IOL due to gestational hypertension, likely with pre-eclampsia.  #Labor: IOL #Pain: IV pain medicine #FWB: Category 1 tracing #ID:  GBS negative #MOF: bottle #MOC: depo #Circ:  n/a  Tillman Sers, DO PGY-1 10/9/20173:39 PM   OB FELLOW HISTORY AND PHYSICAL ATTESTATION  I have seen and examined this patient; I agree with above documentation in the resident's note. After arriving to L&D floor, patient had FB inserted and cytotec placed. About 20 min after, she began having severe range BPs, and her protein:creatinine ratio came back 9.42 (although there was moderate Hb in her urine, may be falsely elevated). Will be admitting/IOL for preeclampsia with severe features. Magnesium drip was started, IV BP meds given. Monitoring closely. Plan to deliver.   Jen Mow, DO OB Fellow 01/10/2016, 7:19 PM

## 2016-01-11 ENCOUNTER — Encounter (HOSPITAL_COMMUNITY): Payer: Self-pay | Admitting: *Deleted

## 2016-01-11 DIAGNOSIS — Z3A38 38 weeks gestation of pregnancy: Secondary | ICD-10-CM

## 2016-01-11 DIAGNOSIS — O114 Pre-existing hypertension with pre-eclampsia, complicating childbirth: Secondary | ICD-10-CM

## 2016-01-11 DIAGNOSIS — O1490 Unspecified pre-eclampsia, unspecified trimester: Secondary | ICD-10-CM

## 2016-01-11 LAB — CBC
HCT: 40.5 % (ref 36.0–46.0)
Hemoglobin: 14.1 g/dL (ref 12.0–15.0)
MCH: 32.3 pg (ref 26.0–34.0)
MCHC: 34.8 g/dL (ref 30.0–36.0)
MCV: 92.7 fL (ref 78.0–100.0)
Platelets: 245 10*3/uL (ref 150–400)
RBC: 4.37 MIL/uL (ref 3.87–5.11)
RDW: 14.1 % (ref 11.5–15.5)
WBC: 16 10*3/uL — ABNORMAL HIGH (ref 4.0–10.5)

## 2016-01-11 LAB — COMPREHENSIVE METABOLIC PANEL
ALT: 18 U/L (ref 14–54)
AST: 29 U/L (ref 15–41)
Albumin: 2.4 g/dL — ABNORMAL LOW (ref 3.5–5.0)
Alkaline Phosphatase: 107 U/L (ref 38–126)
Anion gap: 8 (ref 5–15)
BUN: 8 mg/dL (ref 6–20)
CO2: 24 mmol/L (ref 22–32)
Calcium: 7.5 mg/dL — ABNORMAL LOW (ref 8.9–10.3)
Chloride: 102 mmol/L (ref 101–111)
Creatinine, Ser: 0.64 mg/dL (ref 0.44–1.00)
GFR calc Af Amer: >60 mL/min (ref >60)
GFR calc non Af Amer: >60 mL/min (ref >60)
Glucose, Bld: 104 mg/dL — ABNORMAL HIGH (ref 65–99)
Potassium: 4.3 mmol/L (ref 3.5–5.1)
Sodium: 134 mmol/L — ABNORMAL LOW (ref 135–145)
Total Bilirubin: 0.5 mg/dL (ref 0.3–1.2)
Total Protein: 5.7 g/dL — ABNORMAL LOW (ref 6.5–8.1)

## 2016-01-11 LAB — HIV ANTIBODY (ROUTINE TESTING W REFLEX): HIV Screen 4th Generation wRfx: NONREACTIVE

## 2016-01-11 LAB — RPR: RPR Ser Ql: NONREACTIVE

## 2016-01-11 MED ORDER — PHENYLEPHRINE 40 MCG/ML (10ML) SYRINGE FOR IV PUSH (FOR BLOOD PRESSURE SUPPORT)
80.0000 ug | PREFILLED_SYRINGE | INTRAVENOUS | Status: DC | PRN
  Filled 2016-01-11: qty 5

## 2016-01-11 MED ORDER — DIPHENHYDRAMINE HCL 25 MG PO CAPS: 25.0000 mg | ORAL_CAPSULE | Freq: Four times a day (QID) | ORAL | Status: DC | PRN

## 2016-01-11 MED ORDER — ONDANSETRON HCL 4 MG/2ML IJ SOLN: 4.0000 mg | INTRAMUSCULAR | Status: DC | PRN

## 2016-01-11 MED ORDER — HYDRALAZINE HCL 20 MG/ML IJ SOLN: 10.0000 mg | Freq: Once | INTRAMUSCULAR | Status: DC | PRN

## 2016-01-11 MED ORDER — ONDANSETRON HCL 4 MG PO TABS: 4.0000 mg | ORAL_TABLET | ORAL | Status: DC | PRN

## 2016-01-11 MED ORDER — DIBUCAINE 1 % RE OINT: 1.0000 "application " | TOPICAL_OINTMENT | RECTAL | Status: DC | PRN

## 2016-01-11 MED ORDER — IBUPROFEN 600 MG PO TABS
600.0000 mg | ORAL_TABLET | Freq: Four times a day (QID) | ORAL | Status: DC
  Administered 2016-01-12 (×2): 600 mg via ORAL
  Filled 2016-01-11 (×2): qty 1

## 2016-01-11 MED ORDER — EPHEDRINE 5 MG/ML INJ
10.0000 mg | INTRAVENOUS | Status: DC | PRN
  Filled 2016-01-11: qty 4

## 2016-01-11 MED ORDER — LABETALOL HCL 5 MG/ML IV SOLN: 20.0000 mg | INTRAVENOUS | Status: DC | PRN

## 2016-01-11 MED ORDER — BENZOCAINE-MENTHOL 20-0.5 % EX AERO
1.0000 "application " | INHALATION_SPRAY | CUTANEOUS | Status: DC | PRN
  Administered 2016-01-12: 1 via TOPICAL
  Filled 2016-01-11: qty 56

## 2016-01-11 MED ORDER — COCONUT OIL OIL: 1.0000 "application " | TOPICAL_OIL | Status: DC | PRN

## 2016-01-11 MED ORDER — DIPHENHYDRAMINE HCL 50 MG/ML IJ SOLN: 12.5000 mg | INTRAMUSCULAR | Status: DC | PRN

## 2016-01-11 MED ORDER — PHENYLEPHRINE 40 MCG/ML (10ML) SYRINGE FOR IV PUSH (FOR BLOOD PRESSURE SUPPORT)
80.0000 ug | PREFILLED_SYRINGE | INTRAVENOUS | Status: DC | PRN
  Filled 2016-01-11: qty 10
  Filled 2016-01-11: qty 5

## 2016-01-11 MED ORDER — WITCH HAZEL-GLYCERIN EX PADS: 1.0000 "application " | MEDICATED_PAD | CUTANEOUS | Status: DC | PRN

## 2016-01-11 MED ORDER — SENNOSIDES-DOCUSATE SODIUM 8.6-50 MG PO TABS
2.0000 | ORAL_TABLET | ORAL | Status: DC
  Administered 2016-01-12: 2 via ORAL
  Filled 2016-01-11: qty 2

## 2016-01-11 MED ORDER — LACTATED RINGERS IV SOLN: 500.0000 mL | Freq: Once | INTRAVENOUS | Status: DC

## 2016-01-11 MED ORDER — SIMETHICONE 80 MG PO CHEW: 80.0000 mg | CHEWABLE_TABLET | ORAL | Status: DC | PRN

## 2016-01-11 MED ORDER — PRENATAL MULTIVITAMIN CH
1.0000 | ORAL_TABLET | Freq: Every day | ORAL | Status: DC
  Administered 2016-01-12 – 2016-01-14 (×3): 1 via ORAL
  Filled 2016-01-11 (×3): qty 1

## 2016-01-11 MED ORDER — TERBUTALINE SULFATE 1 MG/ML IJ SOLN: 0.2500 mg | Freq: Once | INTRAMUSCULAR | Status: DC | PRN

## 2016-01-11 MED ORDER — OXYTOCIN 40 UNITS IN LACTATED RINGERS INFUSION - SIMPLE MED
1.0000 m[IU]/min | INTRAVENOUS | Status: DC
  Administered 2016-01-11: 2 m[IU]/min via INTRAVENOUS

## 2016-01-11 MED ORDER — ACETAMINOPHEN 325 MG PO TABS
650.0000 mg | ORAL_TABLET | ORAL | Status: DC | PRN
  Administered 2016-01-11 – 2016-01-12 (×2): 650 mg via ORAL
  Filled 2016-01-11: qty 2

## 2016-01-11 MED ORDER — TETANUS-DIPHTH-ACELL PERTUSSIS 5-2.5-18.5 LF-MCG/0.5 IM SUSP: 0.5000 mL | Freq: Once | INTRAMUSCULAR | Status: DC

## 2016-01-11 MED ORDER — ZOLPIDEM TARTRATE 5 MG PO TABS: 5.0000 mg | ORAL_TABLET | Freq: Every evening | ORAL | Status: DC | PRN

## 2016-01-11 MED ORDER — FENTANYL 2.5 MCG/ML BUPIVACAINE 1/10 % EPIDURAL INFUSION (WH - ANES)
14.0000 mL/h | INTRAMUSCULAR | Status: DC | PRN
  Filled 2016-01-11: qty 125

## 2016-01-11 NOTE — Progress Notes (Signed)
Patient latest blood pressure improved after 20 mg IV push of labetelol.

## 2016-01-11 NOTE — Progress Notes (Signed)
LABOR PROGRESS NOTE  Kristi Anderson is a 25 y.o. G1P0000 at 9941w4d  admitted for IOL for pre-E with severe features.  Subjective: Doing well. Has been resting. Foley out at 0500.  Objective: BP 138/90   Pulse 77   Temp 97.7 F (36.5 C) (Oral)   Resp 16   Ht 5\' 2"  (1.575 m)   Wt 77.1 kg (170 lb)   LMP 04/23/2015 (Approximate)   SpO2 97%   BMI 31.09 kg/m  or  Vitals:   01/11/16 0330 01/11/16 0400 01/11/16 0430 01/11/16 0500  BP: (!) 147/100 (!) 139/94 (!) 130/93 138/90  Pulse: 84 85 83 77  Resp: 15 15 15 16   Temp: 97.7 F (36.5 C)     TempSrc: Oral     SpO2: 98% 97% 97% 97%  Weight:      Height:        Dilation: 4.5 Effacement (%): 80 Station: 0 Presentation: Vertex Exam by:: Craige CottaAmanda Loye, RNC  Labs: Lab Results  Component Value Date   WBC 12.9 (H) 01/10/2016   HGB 14.0 01/10/2016   HCT 39.8 01/10/2016   MCV 90.9 01/10/2016   PLT 239 01/10/2016    Patient Active Problem List   Diagnosis Date Noted  . Gestational hypertension, third trimester 01/10/2016  . Supervision of normal first pregnancy 11/23/2015  . Pregnant 06/01/2015  . Vitamin D deficiency 05/03/2015  . Post traumatic stress disorder 11/18/2014  . Counseling for initiation of birth control method 11/18/2014  . Open wound of left ear 08/07/2014  . 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 Avulsion of left ear 02/10/2014  . Dog bite 02/10/2014    Assessment / Plan: 25 y.o. G1P0000 at 6741w4d here for IOL for pre-E  Labor: s/p Cytotec. Foley out at 0500. Will start Pit. Fetal Wellbeing:  Category I Pain Control:  IV pain meds. Does not want epidural. Anticipated MOD:  SVD  Hilton SinclairKaty D Mayo, MD 01/11/2016, 5:13 AM

## 2016-01-11 NOTE — Progress Notes (Addendum)
S:  Patient coping fair with pain but does not desire pain medication at this time. Has a headache and would like more tylenol; denies epigastric pain, blurry vision or severe headache.   O:  VS: Blood pressure (!) 160/95, pulse 86, temperature 97.7 F (36.5 C), temperature source Oral, resp. rate 18, height 5\' 2"  (1.575 m), weight 77.1 kg (170 lb), last menstrual period 04/23/2015, SpO2 100 %.       Pitocin at 8 mu; mag at 2 mg/hour  Reflexes are + 2.   A: active labor      FHR category 1 with accels and moderate variable   P: COntinue with magnesium and pitocin; will AROM when appropriate.  Collaborating physician Schnek aware of plan Anticipate NSVD   Kristi Anderson CNM 01/11/2016, 8:55 AM

## 2016-01-11 NOTE — Consult Note (Signed)
[facility]  Delivery Note: SVD 10/10/20174:34 PM  I was called to the deliveryroom at the request of the patient's obstetrician Alverda Skeans(Katherine Kooistra) at 3 minutes of age for cyanosis and grunting  PRENATAL HX: This is a 25 y/o G1P0 at 8438 and 4/[redacted] weeks gestation who was admitted for IOL due to gestational hypertension.  She received magnesium prior to delivery.  AROM 5 hours, fluid was meconium stained.    DELIVERY: NICU team arrived at 4 minutes of age and infant was receiving blow by O2 with saturations in the 90s.  Saturations decreased to 70s when blow by was removed so infant again received blow by O2 from ~5 minutes to 7 minutes.  Physical exam was notable for bilateral crackles and there were copious oral secretions.  Infant had been Delee suctioned x2 prior to NICU arrival.  Chest PT performed, and O2 saturations dropped only to 80s. Blow by O2 given again from 8 minutes to 9 minutes, then once removed O2 saturations stayed persistently in 90s.  Suspect initial cyanosis was due to retained fetal lung fluid.  If infant does not continue to improve, please contact the on call Neonatologist.  APGARs 6 and 8. Exam notable for molding and improved crackles on exam. Baby left with nurse to assist parents with skin-to-skin care.   _____________________ Electronically Signed By: Maryan CharLindsey Makar Slatter, MD Neonatologist

## 2016-01-11 NOTE — Progress Notes (Signed)
Patient now 6/70/-2, AROM with Cat 1 tracing. Patient declines epidural at this time although she is uncomfortable. Blood pressures are improved; continue mag, pit and anticipate NSVD.

## 2016-01-12 DIAGNOSIS — O114 Pre-existing hypertension with pre-eclampsia, complicating childbirth: Secondary | ICD-10-CM

## 2016-01-12 DIAGNOSIS — Z3A38 38 weeks gestation of pregnancy: Secondary | ICD-10-CM

## 2016-01-12 LAB — CBC
HCT: 31.3 % — ABNORMAL LOW (ref 36.0–46.0)
Hemoglobin: 10.9 g/dL — ABNORMAL LOW (ref 12.0–15.0)
MCH: 31.9 pg (ref 26.0–34.0)
MCHC: 34.8 g/dL (ref 30.0–36.0)
MCV: 91.5 fL (ref 78.0–100.0)
Platelets: 211 10*3/uL (ref 150–400)
RBC: 3.42 MIL/uL — ABNORMAL LOW (ref 3.87–5.11)
RDW: 14.4 % (ref 11.5–15.5)
WBC: 17.6 10*3/uL — ABNORMAL HIGH (ref 4.0–10.5)

## 2016-01-12 MED ORDER — IBUPROFEN 600 MG PO TABS
600.0000 mg | ORAL_TABLET | Freq: Four times a day (QID) | ORAL | Status: DC | PRN
  Administered 2016-01-12 – 2016-01-14 (×6): 600 mg via ORAL
  Filled 2016-01-12 (×5): qty 1

## 2016-01-12 MED ORDER — RHO D IMMUNE GLOBULIN 1500 UNIT/2ML IJ SOSY
300.0000 ug | PREFILLED_SYRINGE | Freq: Once | INTRAMUSCULAR | Status: AC
  Administered 2016-01-12: 300 ug via INTRAVENOUS
  Filled 2016-01-12: qty 2

## 2016-01-12 MED ORDER — LACTATED RINGERS IV SOLN
INTRAVENOUS | Status: DC
  Administered 2016-01-12: 10:00:00 via INTRAVENOUS

## 2016-01-12 MED ORDER — NIFEDIPINE ER 30 MG PO TB24
30.0000 mg | ORAL_TABLET | Freq: Every day | ORAL | Status: DC
  Administered 2016-01-12 – 2016-01-14 (×3): 30 mg via ORAL
  Filled 2016-01-12 (×6): qty 1

## 2016-01-12 MED ORDER — LACTATED RINGERS IV SOLN
2.0000 g/h | INTRAVENOUS | Status: AC
  Administered 2016-01-12: 2 g/h via INTRAVENOUS
  Filled 2016-01-12: qty 80

## 2016-01-12 MED ORDER — SENNOSIDES-DOCUSATE SODIUM 8.6-50 MG PO TABS: 2.0000 | ORAL_TABLET | Freq: Every evening | ORAL | Status: DC | PRN

## 2016-01-12 NOTE — Progress Notes (Signed)
Post Partum Day #1 Subjective: up ad lib and tolerating PO. Bottle feeding.  Reports fatigue.  Unable to void.    Objective: Blood pressure (!) 159/97, pulse 98, temperature 98.4 F (36.9 C), temperature source Oral, resp. rate 16, height 5\' 2"  (1.575 m), weight 170 lb (77.1 kg), last menstrual period 04/23/2015, SpO2 98 %, unknown if currently breastfeeding.  Physical Exam:  General: alert, cooperative, fatigued and no distress Lochia: appropriate Uterine Fundus: firm Incision: no significant drainage, no dehiscence, no significant erythema DVT Evaluation: No evidence of DVT seen on physical exam. No cords or calf tenderness. No significant calf/ankle edema. 2+DTR's   Recent Labs  01/11/16 0955 01/12/16 0614  HGB 14.1 10.9*  HCT 40.5 31.3*    Assessment/Plan: Contraception planning POP or Depo injections.  B/P's still elevated.  Has not had am dose of Procardia.  Encouraged ambulation, increased fluid intake.  I&O cath as needed.    LOS: 2 days   Roe Coombsachelle A Pia Jedlicka, CNM 01/12/2016, 8:32 AM

## 2016-01-12 NOTE — Progress Notes (Signed)
Patient has not been able to void on own at this time. Per order patient has been intermittent catheterized x2. Will continue to assess. Milon DikesKristina D Olivier Frayre, RN

## 2016-01-12 NOTE — Progress Notes (Signed)
Daily Post Partum Note  Kristi Anderson is a 25 y.o. G1P1001 PPD#1 s/p  SVD/1st (repaired)  @ [redacted]w[redacted]d.  Pregnancy c/b Rh negative, h/o UTI neg TOC  Patient is currently on 24hrs of PP Mg 24hr/overnight events:  Has had I/O cath x 2 for inability to void  Subjective:  No s/s of pre-x.  +ambulation. Minimal lochia and PO is going well  Objective:    Current Vital Signs 24h Vital Sign Ranges  T 98.4 F (36.9 C) Temp  Avg: 98.4 F (36.9 C)  Min: 97.4 F (36.3 C)  Max: 99.1 F (37.3 C)  BP (!) 159/97 BP  Min: 134/88  Max: 168/120  HR 98 Pulse  Avg: 88  Min: 68  Max: 108  RR 16 Resp  Avg: 17.8  Min: 16  Max: 20  SaO2 98 % Not Delivered SpO2  Avg: 98 %  Min: 98 %  Max: 98 %       24 Hour I/O Current Shift I/O  Time Ins Outs 10/10 0701 - 10/11 0700 In: 4624.5 [P.O.:2134; I.V.:2490.5] Out: 3900 [Urine:3650] No intake/output data recorded.    Patient Vitals for the past 24 hrs:  BP Temp Temp src Pulse Resp SpO2  01/12/16 0500 (!) 159/97 98.4 F (36.9 C) Oral 98 16 -  01/12/16 0400 - - - - 16 -  01/12/16 0300 - - - - 16 -  01/12/16 0000 (!) 149/87 99.1 F (37.3 C) Oral 98 16 -  01/11/16 2100 138/85 98.9 F (37.2 C) Oral (!) 102 16 98 %  01/11/16 1830 (!) 151/93 - - 89 18 -  01/11/16 1829 134/88 - - 89 18 -  01/11/16 1800 (!) 151/97 - - 99 18 -  01/11/16 1745 (!) 155/96 - - 98 18 -  01/11/16 1730 (!) 151/78 - - (!) 106 18 -  01/11/16 1715 (!) 154/90 - - 97 20 -  01/11/16 1700 138/90 - - 92 18 -  01/11/16 1645 (!) 135/92 - - 94 20 -  01/11/16 1630 (!) 144/102 97.4 F (36.3 C) Oral (!) 108 18 -  01/11/16 1530 - - - 87 - -  01/11/16 1520 (!) 154/91 - - 68 - -  01/11/16 1515 (!) 156/101 - - 80 - -  01/11/16 1440 (!) 160/109 - - 70 - -  01/11/16 1420 (!) 158/108 - - 86 - -  01/11/16 1406 - - - - - 98 %  01/11/16 1321 (!) 150/99 - - 72 - -  01/11/16 1301 (!) 157/100 - - 69 18 -  01/11/16 1241 (!) 139/93 - - 73 - -  01/11/16 1231 (!) 165/98 - - 77 20 -  01/11/16 1224 (!)  167/100 - - 71 - -  01/11/16 1201 (!) 167/103 98.3 F (36.8 C) Oral 88 18 -  01/11/16 1141 (!) 155/107 - - 88 - -  01/11/16 1131 (!) 168/120 - - (!) 101 18 -  01/11/16 1101 (!) 153/96 - - 85 18 -  01/11/16 1041 (!) 146/97 - - 84 18 -  01/11/16 1031 (!) 159/102 - - 89 18 -  01/11/16 1001 (!) 161/98 - - 89 18 -  01/11/16 0942 (!) 160/97 - - 86 - -  01/11/16 0931 (!) 158/105 - - 91 18 -  01/11/16 0856 (!) 156/97 - - 93 18 -  01/11/16 0819 (!) 160/95 - - 86 18 -  01/11/16 0800 - - - - 18 -   UOP: at least 121mL/hr  General: NAD Abdomen: FF below the umbilicus. nttp Perineum: deferred Skin:  Warm and dry.  Cardiovascular: S1, S2 normal, no murmur, rub or gallop, regular rate and rhythm Respiratory:  Clear to auscultation bilateral. Normal respiratory effort Extremities: no c/c/e Neuro: 1+ brachial b/l  Medications Current Facility-Administered Medications  Medication Dose Route Frequency Provider Last Rate Last Dose  . acetaminophen (TYLENOL) tablet 650 mg  650 mg Oral Q4H PRN Tillman Sersngela C Riccio, DO   650 mg at 01/11/16 2123  . benzocaine-Menthol (DERMOPLAST) 20-0.5 % topical spray 1 application  1 application Topical PRN Tillman SersAngela C Riccio, DO      . coconut oil  1 application Topical PRN Tillman SersAngela C Riccio, DO      . witch hazel-glycerin (TUCKS) pad 1 application  1 application Topical PRN Tillman SersAngela C Riccio, DO       And  . dibucaine (NUPERCAINAL) 1 % rectal ointment 1 application  1 application Rectal PRN Tillman SersAngela C Riccio, DO      . diphenhydrAMINE (BENADRYL) capsule 25 mg  25 mg Oral Q6H PRN Tillman SersAngela C Riccio, DO      . hydrALAZINE (APRESOLINE) injection 10 mg  10 mg Intravenous Once PRN Duane LopeJennifer I Rasch, NP      . hydrALAZINE (APRESOLINE) injection 10 mg  10 mg Intravenous Once PRN Tillman SersAngela C Riccio, DO      . ibuprofen (ADVIL,MOTRIN) tablet 600 mg  600 mg Oral Q6H PRN [place] Bingharlie Devlynn Knoff, MD      . labetalol (NORMODYNE,TRANDATE) injection 20-80 mg  20-80 mg Intravenous Q10 min PRN Tillman SersAngela C  Riccio, DO      . lidocaine (PF) (XYLOCAINE) 1 % injection 30 mL  30 mL Subcutaneous PRN [facility] Hospital[facility] Woodland Mumaw, DO   30 mL at 01/11/16 1625  . magnesium sulfate 40 g in lactated ringers 500 mL (0.08 g/mL) OB infusion  2 g/hr Intravenous Continuous [place] Bingharlie Eddrick Dilone, MD      . NIFEdipine (PROCARDIA-XL/ADALAT CC) 24 hr tablet 30 mg  30 mg Oral Daily [place] Bingharlie Amamda Curbow, MD      . ondansetron (ZOFRAN) tablet 4 mg  4 mg Oral Q4H PRN Tillman SersAngela C Riccio, DO       Or  . ondansetron (ZOFRAN) injection 4 mg  4 mg Intravenous Q4H PRN Tillman SersAngela C Riccio, DO      . prenatal multivitamin tablet 1 tablet  1 tablet Oral Q1200 Angela C Riccio, DO      . senna-docusate (Senokot-S) tablet 2 tablet  2 tablet Oral QHS PRN [place] Bingharlie Myracle Febres, MD      . simethicone (MYLICON) chewable tablet 80 mg  80 mg Oral PRN Tillman SersAngela C Riccio, DO      . Tdap (BOOSTRIX) injection 0.5 mL  0.5 mL Intramuscular Once Tillman SersAngela C Riccio, DO      . zolpidem (AMBIEN) tablet 5 mg  5 mg Oral QHS PRN Tillman Sersngela C Riccio, DO        Labs:   Recent Labs Lab 01/10/16 1511 01/11/16 0955 01/12/16 0614  WBC 12.9* 16.0* 17.6*  HGB 14.0 14.1 10.9*  HCT 39.8 40.5 31.3*  PLT 239 245 211    Recent Labs Lab 01/10/16 1511 01/11/16 0955  NA 135 134*  K 4.2 4.3  CL 105 102  CO2 21* 24  BUN 10 8  CREATININE 0.61 0.64  GLUCOSE 75 104*  CALCIUM 8.6* 7.5*    Assessment & Plan:  Pt doing well *Postpartum/postop: routine care *Rh neg: infant is pos. RhIg work up in process. Needs  rhogam before discharge *Pre-x with severe features: continue with Mg until 1625 today. Procardia 30mg  XL started this AM  *FEN/GI: regular diet. D/w pt that if can't void will place foley *Dispo: PPD#2-3. Needs 1wk BP check  A NEG, will need rhogam / Rubella Immune / Varicella Immune/  RPR negative / HIV negative / HepBsAg negative / Tdap UTD: ordered/Flu shot: ordered/ pap unknown / Bottle  / Contraception: Depo-Provera / Circ: not applicable/ Follow up: Lynnea Maizes MD Attending Center for [facility])

## 2016-01-12 NOTE — Clinical Social Work Maternal (Signed)
  CLINICAL SOCIAL WORK MATERNAL/CHILD NOTE  Patient Details  Name: Kristi Anderson MRN: [id number] Date of Birth: 1990-10-03  Date:  01/12/2016  Clinical Social Worker Initiating Note:  Laurey Arrow Date/ Time Initiated:  01/12/16/1500     Child's Name:  Kristi Anderson   Legal Guardian:  Mother   Need for Interpreter:  None   Date of Referral:  01/12/16     Reason for Referral:  Behavioral Health Issues, including SI    Referral Source:  [employer]   Address:  [address] [place] [address]  Phone number:  [phone]   Household Members:  Self, Significant Other   Natural Supports (not living in the home):  Immediate Family, Friends, Extended Family, Spouse/significant other, Artist Supports: None   Employment: Full-time   Type of Work: Dietitian   Education:  Chiropractor Resources:  [employer]   Other Resources:  [employer], Physicist, medical    Cultural/Religious Considerations Which May Impact Care: none reported  Strengths:  Ability to meet basic needs , Understanding of illness, Home prepared for child , Pediatrician chosen    Risk Factors/Current Problems:  Mental Health Concerns    Cognitive State:  Linear Thinking , Insightful , Goal Oriented , Able to Concentrate , Alert    Mood/Affect:  Bright , Happy , Comfortable , Interested    CSW Assessment: CSW met with MOB to complete an assessment for hx of anxiety and PTSD.  When CSW arrived MOB room visitors were leaving.  CSW inquired about MOB's hx of anxiety and PTDS.  MOB acknowledged a hx of anxiety since age 1, but most recently treated by medication prescribed by MOB's PCP after getting bit by a dog 2 years ago. MOB communicated that MOB stopped taking Zoloft when MOB pregnancy was confirmed.  MOB reports feeling good during pregnancy with little to no symptoms.  CSW also educated MOB about PPD. CSW informed MOB of possible supports and interventions  to decrease PPD.  CSW also encouraged MOB to seek medical attention if needed for increased signs and symptoms for PPD.  CSW offered MOB resources for outpatient interventions, however MOB declined. MOB agreed to reach out to MOB's OBGYN or PCP provided if a need arise. CSW educated MOB on SIDS and MOB appeared knowledgeable.  MOB asked appropriate questions and responded appropriately to CSW questions. CSW also provided MOB with information to add infant to MOB's WIC,Foodstamps, and Medicaid. CSW thanked MOB for meeting with CSW. MOB did not have any additional questions or concerns at this time.  CSW Plan/Description:  No Further Intervention Required/No Barriers to Discharge, Patient/Family Education , Information/Referral to [employer], MSW, [employer] Social Work [phone]   Dimple Nanas, LCSW 01/12/2016, 4:15 PM

## 2016-01-13 DIAGNOSIS — O114 Pre-existing hypertension with pre-eclampsia, complicating childbirth: Secondary | ICD-10-CM

## 2016-01-13 DIAGNOSIS — Z3A38 38 weeks gestation of pregnancy: Secondary | ICD-10-CM

## 2016-01-13 LAB — RH IG WORKUP (INCLUDES ABO/RH)
ABO/RH(D): A NEG
Fetal Screen: NEGATIVE
Gestational Age(Wks): 38
Unit division: 0

## 2016-01-13 MED ORDER — IBUPROFEN 600 MG PO TABS: 600.0000 mg | ORAL_TABLET | Freq: Four times a day (QID) | ORAL | 2 refills | Status: DC | PRN

## 2016-01-13 MED ORDER — NIFEDIPINE ER 30 MG PO TB24: 30.0000 mg | ORAL_TABLET | Freq: Every day | ORAL | 1 refills | Status: DC

## 2016-01-13 MED ORDER — COCONUT OIL OIL: 1.0000 "application " | TOPICAL_OIL | 99 refills | Status: DC | PRN

## 2016-01-13 NOTE — Discharge Summary (Addendum)
Obstetric Discharge Summary Reason for Admission: induction of labor and for severe preeclampsia Prenatal Procedures: NST and ultrasound Intrapartum Procedures: spontaneous vaginal delivery Postpartum Procedures: Mag Drip X24 hours Complications-Operative and Postpartum: vaginal laceration, 1st degree periurethral and right labial Hemoglobin  Date Value Ref Range Status  01/12/2016 10.9 (L) 12.0 - 15.0 g/dL Final    Comment:    REPEATED TO VERIFY DELTA CHECK NOTED    HCT  Date Value Ref Range Status  01/12/2016 31.3 (L) 36.0 - 46.0 % Final   Hematocrit  Date Value Ref Range Status  11/01/2015 41.5 34.0 - 46.6 % Final   Vitals:   01/14/16 1742 01/14/16 1831  BP: (!) 147/96 (!) 143/93  Pulse: (!) 111   Resp: 17   Temp: 98.8 F (37.1 C)     Physical Exam:  General: alert, cooperative and no distress Lochia: appropriate Uterine Fundus: firm Incision: no significant drainage, no dehiscence, no significant erythema DVT Evaluation: No evidence of DVT seen on physical exam. No cords or calf tenderness. No significant calf/ankle edema.  Discharge Diagnoses: Term Pregnancy-delivered and Preelampsia with severe features  Discharge Information: Date: .01/14/2016 Activity: pelvic rest Diet: routine Medications:  Current Facility-Administered Medications:  .  acetaminophen (TYLENOL) tablet 650 mg, 650 mg, Oral, Q4H PRN, Tillman Sers, DO, 650 mg at 01/12/16 2127 .  benzocaine-Menthol (DERMOPLAST) 20-0.5 % topical spray 1 application, 1 application, Topical, PRN, Tillman Sers, DO, 1 application at 01/12/16 1233 .  coconut oil, 1 application, Topical, PRN, Tillman Sers, DO .  witch hazel-glycerin (TUCKS) pad 1 application, 1 application, Topical, PRN **AND** dibucaine (NUPERCAINAL) 1 % rectal ointment 1 application, 1 application, Rectal, PRN, Tillman Sers, DO .  diphenhydrAMINE (BENADRYL) capsule 25 mg, 25 mg, Oral, Q6H PRN, Marcell Anger Riccio, DO .  hydrALAZINE  (APRESOLINE) injection 10 mg, 10 mg, Intravenous, Once PRN, Duane Lope, NP .  hydrALAZINE (APRESOLINE) injection 10 mg, 10 mg, Intravenous, Once PRN, Tillman Sers, DO .  methyldopa (ALDOMET) tablet 250 mg, 250 mg, Oral, Daily, 250 mg at 01/14/16 1159 **AND** hydrochlorothiazide (HYDRODIURIL) tablet 25 mg, 25 mg, Oral, Daily, [place] Bing, MD, 25 mg at 01/14/16 1159 .  ibuprofen (ADVIL,MOTRIN) tablet 600 mg, 600 mg, Oral, Q6H PRN, [place] Bing, MD, 600 mg at 01/14/16 1336 .  labetalol (NORMODYNE,TRANDATE) injection 20-80 mg, 20-80 mg, Intravenous, Q10 min PRN, Marcell Anger Riccio, DO .  lactated ringers infusion, , Intravenous, Continuous, [place] Bing, MD, Stopped at 01/12/16 1638 .  NIFEdipine (PROCARDIA-XL/ADALAT CC) 24 hr tablet 30 mg, 30 mg, Oral, Daily, [place] Bing, MD, 30 mg at 01/14/16 0917 .  ondansetron (ZOFRAN) tablet 4 mg, 4 mg, Oral, Q4H PRN **OR** ondansetron (ZOFRAN) injection 4 mg, 4 mg, Intravenous, Q4H PRN, Tillman Sers, DO .  prenatal multivitamin tablet 1 tablet, 1 tablet, Oral, Q1200, Tillman Sers, DO, 1 tablet at 01/14/16 1159 .  senna-docusate (Senokot-S) tablet 2 tablet, 2 tablet, Oral, QHS PRN,  Bing, MD .  simethicone (MYLICON) chewable tablet 80 mg, 80 mg, Oral, PRN, Tillman Sers, DO .  Tdap (BOOSTRIX) injection 0.5 mL, 0.5 mL, Intramuscular, Once, Angela C Riccio, DO .  zolpidem (AMBIEN) tablet 5 mg, 5 mg, Oral, QHS PRN, Tillman Sers, DO     Condition: stable Instructions: refer to practice specific booklet Discharge to: home Follow-up Information    Rachelle A Denney, CNM Follow up in 1 week(s).   Specialty:  Certified Nurse Midwife Contact information: [address]  [address] [place] [id number] [phone]           Newborn Data: Live born female  Birth Weight: 6 lb 1 oz (2750 g) APGAR: 6, 8  Home with mother.  Roe CoombsRachelle A Denney, CNM 01/13/2016, 1:06 PM   Updated and reviewed Adam PhenixJames G Joaquina Nissen,  MD 01/14/2016

## 2016-01-13 NOTE — Progress Notes (Addendum)
Post Partum Day #2 Subjective: no complaints, up ad lib, voiding and tolerating PO  Objective: Blood pressure 140/79, pulse 92, temperature 98.3 F (36.8 C), temperature source Oral, resp. rate 14, height 5\' 2"  (1.575 m), weight 159 lb (72.1 kg), last menstrual period 04/23/2015, SpO2 98 %, unknown if currently breastfeeding.  Physical Exam:  General: alert, cooperative and no distress Lochia: appropriate Uterine Fundus: firm Incision: no significant drainage, no dehiscence, no significant erythema DVT Evaluation: No evidence of DVT seen on physical exam. No cords or calf tenderness. No significant calf/ankle edema.   Recent Labs  01/11/16 0955 01/12/16 0614  HGB 14.1 10.9*  HCT 40.5 31.3*    Assessment/Plan: Plan for discharge tomorrow and Contraception Depo or POP  Severe Pre-Eclampsia: on procardia, B/P's normalizing between 162/94-140/79 on procardia.   Rhogam given 01/12/16  LOS: 3 days   Kristi Anderson, CNM 01/13/2016, 8:39 AM

## 2016-01-13 NOTE — Discharge Instructions (Signed)

## 2016-01-14 DIAGNOSIS — O114 Pre-existing hypertension with pre-eclampsia, complicating childbirth: Secondary | ICD-10-CM

## 2016-01-14 DIAGNOSIS — Z3A38 38 weeks gestation of pregnancy: Secondary | ICD-10-CM

## 2016-01-14 LAB — TYPE AND SCREEN
ABO/RH(D): A NEG
Antibody Screen: POSITIVE
DAT, IgG: NEGATIVE
Unit division: 0
Unit division: 0

## 2016-01-14 MED ORDER — METHYLDOPA 250 MG PO TABS
250.0000 mg | ORAL_TABLET | Freq: Every day | ORAL | Status: DC
  Administered 2016-01-14: 250 mg via ORAL
  Filled 2016-01-14 (×2): qty 1

## 2016-01-14 MED ORDER — METHYLDOPA-HYDROCHLOROTHIAZIDE 250-25 MG PO TABS: 1.0000 | ORAL_TABLET | Freq: Every day | ORAL | Status: DC

## 2016-01-14 MED ORDER — HYDROCHLOROTHIAZIDE 25 MG PO TABS
25.0000 mg | ORAL_TABLET | Freq: Every day | ORAL | Status: DC
  Administered 2016-01-14: 25 mg via ORAL
  Filled 2016-01-14: qty 1

## 2016-01-14 NOTE — Progress Notes (Signed)
Post Partum Day #3, preeclampsia with severe features.  Did desire discharge yesterday until infant went under phototherapy.   Blood pressures were stable for a few hours on 01/13/16.  Elevated up to 151/86 this AM.  On Procardia 30 mg XL.  States HA last night.  Denies HA this AM.  RH-: had Rhogam 01/12/16.    Subjective: up ad lib, voiding and tolerating PO.  Fatigue.  Bilateral lower leg edema.    Objective: Blood pressure (!) 151/86, pulse 95, temperature 98.4 F (36.9 C), temperature source Oral, resp. rate 18, height 5\' 2"  (1.575 m), weight 152 lb 0.8 oz (69 kg), last menstrual period 04/23/2015, SpO2 95 %, unknown if currently breastfeeding.  Physical Exam:  General: alert, cooperative, fatigued and no distress Lochia: appropriate Uterine Fundus: firm Incision: no significant drainage, no dehiscence, no significant erythema DVT Evaluation: No evidence of DVT seen on physical exam. Negative Homan's sign. No cords or calf tenderness. No significant calf/ankle edema.   Recent Labs  01/11/16 0955 01/12/16 0614  HGB 14.1 10.9*  HCT 40.5 31.3*    Assessment/Plan: Plan for discharge tomorrow and Contraception POP or Depo injections.   Pending stable blood pressures: methyldopa and HCTZ added for blood pressure control.     LOS: 4 days   Kristi Anderson, CNM 01/14/2016, 9:00 AM

## 2016-01-14 NOTE — Progress Notes (Signed)
Patient education complete, questions answered. Assured patient that she will still be able to get meals while the baby remains a pt. Gave motrin for prn pain relief, VS stable 142/99, 98.4, 100%on RA, P 98 discharged mom to room 316, where infant remains a pt.

## 2016-01-18 ENCOUNTER — Ambulatory Visit: Payer: 59 | Admitting: Certified Nurse Midwife

## 2016-01-18 VITALS — BP 118/86 | HR 92

## 2016-01-18 DIAGNOSIS — Z0130 Encounter for examination of blood pressure without abnormal findings: Secondary | ICD-10-CM

## 2016-01-19 NOTE — Progress Notes (Signed)
Pt is in office for postpartum BP check. Pt states she is currently taking medication as prescribed. Pt has no other symptoms. Pt advised to continue medication until PP visit next week and can discuss with her provider.

## 2016-01-26 ENCOUNTER — Ambulatory Visit: Payer: 59 | Admitting: Certified Nurse Midwife

## 2016-02-03 ENCOUNTER — Ambulatory Visit (INDEPENDENT_AMBULATORY_CARE_PROVIDER_SITE_OTHER): Payer: 59 | Admitting: Advanced Practice Midwife

## 2016-02-03 DIAGNOSIS — R519 Headache, unspecified: Secondary | ICD-10-CM

## 2016-02-03 DIAGNOSIS — O9089 Other complications of the puerperium, not elsewhere classified: Secondary | ICD-10-CM

## 2016-02-03 DIAGNOSIS — Z30013 Encounter for initial prescription of injectable contraceptive: Secondary | ICD-10-CM | POA: Diagnosis not present

## 2016-02-03 DIAGNOSIS — J302 Other seasonal allergic rhinitis: Secondary | ICD-10-CM

## 2016-02-03 DIAGNOSIS — R51 Headache: Secondary | ICD-10-CM

## 2016-02-03 DIAGNOSIS — Z3202 Encounter for pregnancy test, result negative: Secondary | ICD-10-CM | POA: Diagnosis not present

## 2016-02-03 LAB — POCT URINE PREGNANCY: Preg Test, Ur: NEGATIVE

## 2016-02-03 MED ORDER — CETIRIZINE HCL 10 MG PO TABS: 10.0000 mg | ORAL_TABLET | Freq: Every day | ORAL | 1 refills | Status: DC

## 2016-02-03 MED ORDER — MEDROXYPROGESTERONE ACETATE 150 MG/ML IM SUSP: 150.0000 mg | INTRAMUSCULAR | 3 refills | Status: DC

## 2016-02-03 NOTE — Progress Notes (Signed)
Post Partum Exam  Kristi Anderson is a 25 y.o. 41P1001 female who presents for a postpartum visit. She is 4 weeks postpartum following a spontaneous vaginal delivery. Dx's with preeclampsia with severe features and received magnesium sulfate. I have fully reviewed the prenatal and intrapartum course. The delivery was at 38 gestational weeks.  Anesthesia: none. Postpartum course has been complicated by continued hypertension. Is taking Procardia. Pt has had increase in HA since delivery, no changes in vision. Baby's course has been doing well. Baby is feeding by bottle - Similac Advance. Bleeding thin lochia. Bowel function is normal. Bladder function is normal. Patient is not sexually active. Contraception method is abstinence, would like to discuss birth control options. Postpartum depression screening:neg   The following portions of the patient's history were reviewed and updated as appropriate: allergies, current medications, past family history, past medical history, past social history, past surgical history and problem list.  Review of Systems Pertinent items are noted in HPI. Also complains of irritated throat and possible postnasal drip 4 weeks. Denies fever, chills, cough, myalgias. Thinks she might have seasonal allergies.  Objective:    BP 116/78 mmHg  Pulse 78  Resp 16  Ht 5\' 5"  (1.651 m)  Wt 211 lb (95.709 kg)  BMI 35.11 kg/m2  Breastfeeding? Yes  General:  alert, cooperative, appears stated age and no distress   Breasts:  inspection negative, no nipple discharge or bleeding, no masses or nodularity palpable  Lungs: clear to auscultation bilaterally  Heart:  regular rate and rhythm, S1, S2 normal, no murmur, click, rub or gallop  Abdomen: soft, non-tender; bowel sounds normal; no masses,  no organomegaly   Vulva:  not evaluated        Assessment:    Normal Postpartum exam. Normal blood pressure today. Low suspicion for continued preeclampsia. Question if headaches could be  related to blood pressure dropping too low sometimes. Will try taking patient off medication and see if that helps with headaches.   1. Encounter for initial prescription of injectable contraceptive  - medroxyPROGESTERone (DEPO-PROVERA) 150 MG/ML injection; Inject 1 mL (150 mg total) into the muscle every 3 (three) months.  Dispense: 1 mL; Refill: 3 - POCT urine pregnancy  2. Acute seasonal allergic rhinitis, unspecified trigger  - cetirizine (ZYRTEC) 10 MG tablet; Take 1 tablet (10 mg total) by mouth daily.  Dispense: 30 tablet; Refill: 1  3. Postpartum care and examination  4. Headache of unknown etiology  Plan:    1. Contraception: Depo-Provera injections 2. Headache Red flags reviewed 3. Follow up in: 1 week for blood pressure check, Depo-Provera and re-eval headache or sooner as needed if symptoms worsen.

## 2016-02-03 NOTE — Patient Instructions (Signed)
Allergic Rhinitis Allergic rhinitis is when the mucous membranes in the nose respond to allergens. Allergens are particles in the air that cause your body to have an allergic reaction. This causes you to release allergic antibodies. Through a chain of events, these eventually cause you to release histamine into the blood stream. Although meant to protect the body, it is this release of histamine that causes your discomfort, such as frequent sneezing, congestion, and an itchy, runny nose.  CAUSES Seasonal allergic rhinitis (hay fever) is caused by pollen allergens that may come from grasses, trees, and weeds. Year-round allergic rhinitis (perennial allergic rhinitis) is caused by allergens such as house dust mites, pet dander, and mold spores. SYMPTOMS  Nasal stuffiness (congestion).  Itchy, runny nose with sneezing and tearing of the eyes. DIAGNOSIS Your health care provider can help you determine the allergen or allergens that trigger your symptoms. If you and your health care provider are unable to determine the allergen, skin or blood testing may be used. Your health care provider will diagnose your condition after taking your health history and performing a physical exam. Your health care provider may assess you for other related conditions, such as asthma, pink eye, or an ear infection. TREATMENT Allergic rhinitis does not have a cure, but it can be controlled by:  Medicines that block allergy symptoms. These may include allergy shots, nasal sprays, and oral antihistamines.  Avoiding the allergen. Hay fever may often be treated with antihistamines in pill or nasal spray forms. Antihistamines block the effects of histamine. There are over-the-counter medicines that may help with nasal congestion and swelling around the eyes. Check with your health care provider before taking or giving this medicine. If avoiding the allergen or the medicine prescribed do not work, there are many new medicines  your health care provider can prescribe. Stronger medicine may be used if initial measures are ineffective. Desensitizing injections can be used if medicine and avoidance does not work. Desensitization is when a patient is given ongoing shots until the body becomes less sensitive to the allergen. Make sure you follow up with your health care provider if problems continue. HOME CARE INSTRUCTIONS It is not possible to completely avoid allergens, but you can reduce your symptoms by taking steps to limit your exposure to them. It helps to know exactly what you are allergic to so that you can avoid your specific triggers. SEEK MEDICAL CARE IF:  You have a fever.  You develop a cough that does not stop easily (persistent).  You have shortness of breath.  You start wheezing.  Symptoms interfere with normal daily activities.   This information is not intended to replace advice given to you by your health care provider. Make sure you discuss any questions you have with your health care provider.   Document Released: 12/13/2000 Document Revised: 04/10/2014 Document Reviewed: 11/25/2012 Elsevier Interactive Patient Education 2016 Elsevier Inc.  

## 2016-02-09 ENCOUNTER — Encounter: Payer: Self-pay | Admitting: Advanced Practice Midwife

## 2016-02-14 ENCOUNTER — Ambulatory Visit (INDEPENDENT_AMBULATORY_CARE_PROVIDER_SITE_OTHER): Payer: 59 | Admitting: *Deleted

## 2016-02-14 VITALS — BP 144/83 | HR 98 | Wt 144.0 lb

## 2016-02-14 DIAGNOSIS — Z30013 Encounter for initial prescription of injectable contraceptive: Secondary | ICD-10-CM | POA: Diagnosis not present

## 2016-02-14 DIAGNOSIS — Z3202 Encounter for pregnancy test, result negative: Secondary | ICD-10-CM

## 2016-02-14 DIAGNOSIS — Z3042 Encounter for surveillance of injectable contraceptive: Secondary | ICD-10-CM | POA: Diagnosis not present

## 2016-02-14 LAB — POCT URINE PREGNANCY: Preg Test, Ur: NEGATIVE

## 2016-02-14 MED ORDER — MEDROXYPROGESTERONE ACETATE 150 MG/ML IM SUSP
150.0000 mg | INTRAMUSCULAR | Status: AC
  Administered 2016-02-14: 150 mg via INTRAMUSCULAR

## 2016-02-15 ENCOUNTER — Encounter: Payer: Self-pay | Admitting: *Deleted

## 2016-03-01 ENCOUNTER — Ambulatory Visit: Payer: 59 | Admitting: Certified Nurse Midwife

## 2016-03-21 ENCOUNTER — Ambulatory Visit (INDEPENDENT_AMBULATORY_CARE_PROVIDER_SITE_OTHER): Payer: 59 | Admitting: Certified Nurse Midwife

## 2016-03-21 ENCOUNTER — Encounter: Payer: Self-pay | Admitting: Certified Nurse Midwife

## 2016-03-21 NOTE — Progress Notes (Signed)
Post Partum Exam  Kristi Anderson is a 25 y.o. 301P1001 female who presents for a postpartum visit. She is 6 weeks postpartum following a spontaneous vaginal delivery. I have fully reviewed the prenatal and intrapartum course. The delivery was at 3874w4d gestational weeks.  Anesthesia: none. Postpartum course has been uncompllicated. 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 Bleeding has been constant x 3 weeks. Changing pad every 3 hrs. . Bowel function is normal. Bladder function is normal. Patient is sexually active. Contraception method is Depo-Provera injections. Postpartum depression screening:neg.  Baby has colic.  Patient is sexually active.  States that she has been bleeding for 4 weeks since 1st depo injection; light spotting to period like bleeding.    The following portions of the patient's history were reviewed and updated as appropriate: allergies, current medications, past family history, past medical history, past social history, past surgical history and problem list.  Review of Systems Pertinent items noted in HPI and remainder of comprehensive ROS otherwise negative.    Objective:    BP 116/78 mmHg  Pulse 78  Resp 16  Ht 5\' 5"  (1.651 m)  Wt 211 lb (95.709 kg)  BMI 35.11 kg/m2  Breastfeeding? Yes  General:  alert, cooperative and no distress   Breasts:  inspection negative, no nipple discharge or bleeding, no masses or nodularity palpable  Lungs: clear to auscultation bilaterally  Heart:  regular rate and rhythm, S1, S2 normal, no murmur, click, rub or gallop  Abdomen: soft, non-tender; bowel sounds normal; no masses,  no organomegaly  Pelvic Exam: Not performed.        Assessment:    Normal 6 week postpartum exam. Pap smear not done at today's visit.   H/O Preeclampsia; normotensive today  Bleeding with Depo Injections   Plan:   1. Contraception: Depo-Provera injections 2. F/U early February for annual  exam; last pap smear 04/29/15: normal 3. Follow up in: 2 months or as needed.

## 2016-04-10 ENCOUNTER — Encounter: Payer: Self-pay | Admitting: *Deleted

## 2016-04-24 ENCOUNTER — Encounter: Payer: Self-pay | Admitting: *Deleted

## 2016-06-14 ENCOUNTER — Other Ambulatory Visit: Payer: Self-pay | Admitting: Adult Health

## 2017-10-09 ENCOUNTER — Ambulatory Visit (INDEPENDENT_AMBULATORY_CARE_PROVIDER_SITE_OTHER): Payer: BLUE CROSS/BLUE SHIELD | Admitting: Adult Health

## 2017-10-09 ENCOUNTER — Encounter: Payer: Self-pay | Admitting: Adult Health

## 2017-10-09 VITALS — BP 125/79 | HR 84 | Ht 63.0 in | Wt 139.5 lb

## 2017-10-09 DIAGNOSIS — N926 Irregular menstruation, unspecified: Secondary | ICD-10-CM

## 2017-10-09 DIAGNOSIS — Z3202 Encounter for pregnancy test, result negative: Secondary | ICD-10-CM

## 2017-10-09 LAB — POCT URINE PREGNANCY: Preg Test, Ur: NEGATIVE

## 2017-10-09 MED ORDER — CITRANATAL 90 DHA 90-1 & 300 MG PO MISC
ORAL | 99 refills | Status: DC
Start: 2017-10-09 — End: 2018-04-26

## 2017-10-09 NOTE — Progress Notes (Signed)
  Subjective:     Patient ID: Pricilla RiffleElizabeth Farino, female   DOB: [birth year], 27 y.o.   MRN: [id number]  HPI Lanora Manislizabeth is a 27 year old white female, in complaining of periods varying by about 7 days. [facility], had labs with them.   Review of Systems Periods are varying by about 7 days.sometimes just 2 days Has lost about 15 lbs in last year, cholesterol was high  She is not on birth control, and is not trying to get pregnant, but if she did it is OK her daughter is getting close to 2.   Reviewed past medical,surgical, social and family history. Reviewed medications and allergies.     Objective:   Physical Exam BP 125/79 (BP Location: Left Arm, Patient Position: Sitting, Cuff Size: Normal)   Pulse 84   Ht 5\' 3"  (1.6 m)   Wt 139 lb 8 oz (63.3 kg)   LMP 09/27/2017 (Approximate)   Breastfeeding? No   BMI 24.71 kg/m UPT negative. Skin warm and dry. Neck: mid line trachea, normal thyroid, good ROM, no lymphadenopathy noted. Lungs: clear to ausculation bilaterally. Cardiovascular: regular rate and rhythm. Discussed that a normal cycle is every 21-35 days, and about timing of ovulation. Will rx prenatal vitamins with folic acid.  Keep using APP in phone to track cycle.     Assessment:     1. Irregular periods/menstrual cycles   2. Pregnancy examination or test, negative result       Plan:     Meds ordered this encounter  Medications  . Prenat w/o A-[id number]-DHA (CITRANATAL 90 DHA) 90-1 & 300 MG MISC    Sig: Take daily    Dispense:  60 each    Refill:  prn    Order Specific Question:   Supervising Provider    Answer:   Lazaro ArmsEURE, LUTHER H [[id number]]  Return in January for pap and physical, or before if needed

## 2018-04-03 NOTE — L&D Delivery Note (Signed)
OB/GYN Faculty Practice Delivery Note  Kristi Anderson is a 28 y.o. G2P1001 s/p SVD at [redacted]w[redacted]d. She was admitted for IOL secondary to [facility].   ROM: 1h 4m with clear fluid GBS Status: Negative (08/10 0000) Maximum Maternal Temperature: 98.63F  Labor Progress: . Patient presented to L&D for IOL secondary to [facility]. Initial SVE: 2/50/-2. Patient had Cytotec, Pitocin and AROM. She received an Epidural. She then progressed to complete.   Delivery Date/Time: 9/6 @ 2107 Delivery: Called to room and patient was complete and pushing. Mother pushed for several minutes with poor maternal effort. Fetal head crowning and heart tones in 60's. Mother pushed several times and unable to fully deliver fetal head. Left mediolateral episiotomy done and then fetal head delivered in LOA position. No nuchal cord present. Shoulder and body delivered in usual fashion. Infant with spontaneous cry, placed on mother's abdomen, dried and stimulated. Cord clamped x 2 after 1-minute delay, and cut by FOB. Cord blood drawn. Placenta delivered spontaneously with gentle cord traction. Fundus firm with massage and Pitocin. Labia, perineum, vagina, and cervix inspected inspected with 2nd degree perineal laceration caused by episiotomy. Laceration repaired with 3-0 Vicryl in a standard fashion.  Baby Weight: pending   Placenta: Sent to L&D Complications: Episiotomy  Lacerations: 2nd degree perineal  EBL: 306 mL Analgesia: Epidural   Infant: APGAR (1 MIN): 9   APGAR (5 MINS): 9   APGAR (10 MINS):     Barrington Ellison, MD [facility] Fellow, [facility] for [facility], [place] 12/08/2018, 9:31 PM

## 2018-04-26 ENCOUNTER — Encounter: Payer: Self-pay | Admitting: Adult Health

## 2018-04-26 ENCOUNTER — Ambulatory Visit (INDEPENDENT_AMBULATORY_CARE_PROVIDER_SITE_OTHER): Payer: BLUE CROSS/BLUE SHIELD | Admitting: Adult Health

## 2018-04-26 VITALS — BP 129/84 | HR 98 | Ht 63.0 in | Wt 138.0 lb

## 2018-04-26 DIAGNOSIS — Z3201 Encounter for pregnancy test, result positive: Secondary | ICD-10-CM

## 2018-04-26 DIAGNOSIS — O26851 Spotting complicating pregnancy, first trimester: Secondary | ICD-10-CM

## 2018-04-26 DIAGNOSIS — N926 Irregular menstruation, unspecified: Secondary | ICD-10-CM | POA: Diagnosis not present

## 2018-04-26 DIAGNOSIS — O09299 Supervision of pregnancy with other poor reproductive or obstetric history, unspecified trimester: Secondary | ICD-10-CM

## 2018-04-26 DIAGNOSIS — Z3A01 Less than 8 weeks gestation of pregnancy: Secondary | ICD-10-CM

## 2018-04-26 DIAGNOSIS — R110 Nausea: Secondary | ICD-10-CM | POA: Insufficient documentation

## 2018-04-26 DIAGNOSIS — O3680X0 Pregnancy with inconclusive fetal viability, not applicable or unspecified: Secondary | ICD-10-CM

## 2018-04-26 LAB — POCT URINE PREGNANCY: Preg Test, Ur: POSITIVE — AB

## 2018-04-26 MED ORDER — PROMETHAZINE HCL 25 MG PO TABS: 25.0000 mg | ORAL_TABLET | Freq: Four times a day (QID) | ORAL | 1 refills | Status: DC | PRN

## 2018-04-26 MED ORDER — PRENATAL PLUS 27-1 MG PO TABS
1.0000 | ORAL_TABLET | Freq: Every day | ORAL | 12 refills | Status: DC
Start: 2018-04-26 — End: 2019-08-15

## 2018-04-26 NOTE — Patient Instructions (Signed)
First Trimester of Pregnancy  The first trimester of pregnancy is from week 1 until the end of week 13 (months 1 through 3). A week after a sperm fertilizes an egg, the egg will implant on the wall of the uterus. This embryo will begin to develop into a baby. Genes from you and your partner will form the baby. The female genes will determine whether the baby will be a boy or a girl. At 6-8 weeks, the eyes and face will be formed, and the heartbeat can be seen on ultrasound. At the end of 12 weeks, all the baby's organs will be formed.  Now that you are pregnant, you will want to do everything you can to have a healthy baby. Two of the most important things are to get good prenatal care and to follow your health care provider's instructions. Prenatal care is all the medical care you receive before the baby's birth. This care will help prevent, find, and treat any problems during the pregnancy and childbirth.  Body changes during your first trimester  Your body goes through many changes during pregnancy. The changes vary from woman to woman.   You may gain or lose a couple of pounds at first.   You may feel sick to your stomach (nauseous) and you may throw up (vomit). If the vomiting is uncontrollable, call your health care provider.   You may tire easily.   You may develop headaches that can be relieved by medicines. All medicines should be approved by your health care provider.   You may urinate more often. Painful urination may mean you have a bladder infection.   You may develop heartburn as a result of your pregnancy.   You may develop constipation because certain hormones are causing the muscles that push stool through your intestines to slow down.   You may develop hemorrhoids or swollen veins (varicose veins).   Your breasts may begin to grow larger and become tender. Your nipples may stick out more, and the tissue that surrounds them (areola) may become darker.   Your gums may bleed and may be  sensitive to brushing and flossing.   Dark spots or blotches (chloasma, mask of pregnancy) may develop on your face. This will likely fade after the baby is born.   Your menstrual periods will stop.   You may have a loss of appetite.   You may develop cravings for certain kinds of food.   You may have changes in your emotions from day to day, such as being excited to be pregnant or being concerned that something may go wrong with the pregnancy and baby.   You may have more vivid and strange dreams.   You may have changes in your hair. These can include thickening of your hair, rapid growth, and changes in texture. Some women also have hair loss during or after pregnancy, or hair that feels dry or thin. Your hair will most likely return to normal after your baby is born.  What to expect at prenatal visits  During a routine prenatal visit:   You will be weighed to make sure you and the baby are growing normally.   Your blood pressure will be taken.   Your abdomen will be measured to track your baby's growth.   The fetal heartbeat will be listened to between weeks 10 and 14 of your pregnancy.   Test results from any previous visits will be discussed.  Your health care provider may ask you:     How you are feeling.   If you are feeling the baby move.   If you have had any abnormal symptoms, such as leaking fluid, bleeding, severe headaches, or abdominal cramping.   If you are using any tobacco products, including cigarettes, chewing tobacco, and electronic cigarettes.   If you have any questions.  Other tests that may be performed during your first trimester include:   Blood tests to find your blood type and to check for the presence of any previous infections. The tests will also be used to check for low iron levels (anemia) and protein on red blood cells (Rh antibodies). Depending on your risk factors, or if you previously had diabetes during pregnancy, you may have tests to check for high blood sugar  that affects pregnant women (gestational diabetes).   Urine tests to check for infections, diabetes, or protein in the urine.   An ultrasound to confirm the proper growth and development of the baby.   Fetal screens for spinal cord problems (spina bifida) and Down syndrome.   HIV (human immunodeficiency virus) testing. Routine prenatal testing includes screening for HIV, unless you choose not to have this test.   You may need other tests to make sure you and the baby are doing well.  Follow these instructions at home:  Medicines   Follow your health care provider's instructions regarding medicine use. Specific medicines may be either safe or unsafe to take during pregnancy.   Take a prenatal vitamin that contains at least 600 micrograms (mcg) of folic acid.   If you develop constipation, try taking a stool softener if your health care provider approves.  Eating and drinking     Eat a balanced diet that includes fresh fruits and vegetables, whole grains, good sources of protein such as meat, eggs, or tofu, and low-fat dairy. Your health care provider will help you determine the amount of weight gain that is right for you.   Avoid raw meat and uncooked cheese. These carry germs that can cause birth defects in the baby.   Eating four or five small meals rather than three large meals a day may help relieve nausea and vomiting. If you start to feel nauseous, eating a few soda crackers can be helpful. Drinking liquids between meals, instead of during meals, also seems to help ease nausea and vomiting.   Limit foods that are high in fat and processed sugars, such as fried and sweet foods.   To prevent constipation:  ? Eat foods that are high in fiber, such as fresh fruits and vegetables, whole grains, and beans.  ? Drink enough fluid to keep your urine clear or pale yellow.  Activity   Exercise only as directed by your health care provider. Most women can continue their usual exercise routine during  pregnancy. Try to exercise for 30 minutes at least 5 days a week. Exercising will help you:  ? Control your weight.  ? Stay in shape.  ? Be prepared for labor and delivery.   Experiencing pain or cramping in the lower abdomen or lower back is a good sign that you should stop exercising. Check with your health care provider before continuing with normal exercises.   Try to avoid standing for long periods of time. Move your legs often if you must stand in one place for a long time.   Avoid heavy lifting.   Wear low-heeled shoes and practice good posture.   You may continue to have sex unless your health care   provider tells you not to.  Relieving pain and discomfort   Wear a good support bra to relieve breast tenderness.   Take warm sitz baths to soothe any pain or discomfort caused by hemorrhoids. Use hemorrhoid cream if your health care provider approves.   Rest with your legs elevated if you have leg cramps or low back pain.   If you develop varicose veins in your legs, wear support hose. Elevate your feet for 15 minutes, 3-4 times a day. Limit salt in your diet.  Prenatal care   Schedule your prenatal visits by the twelfth week of pregnancy. They are usually scheduled monthly at first, then more often in the last 2 months before delivery.   Write down your questions. Take them to your prenatal visits.   Keep all your prenatal visits as told by your health care provider. This is important.  Safety   Wear your seat belt at all times when driving.   Make a list of emergency phone numbers, including numbers for family, friends, the hospital, and police and fire departments.  General instructions   Ask your health care provider for a referral to a local prenatal education class. Begin classes no later than the beginning of month 6 of your pregnancy.   Ask for help if you have counseling or nutritional needs during pregnancy. Your health care provider can offer advice or refer you to specialists for help  with various needs.   Do not use hot tubs, steam rooms, or saunas.   Do not douche or use tampons or scented sanitary pads.   Do not cross your legs for long periods of time.   Avoid cat litter boxes and soil used by cats. These carry germs that can cause birth defects in the baby and possibly loss of the fetus by miscarriage or stillbirth.   Avoid all smoking, herbs, alcohol, and medicines not prescribed by your health care provider. Chemicals in these products affect the formation and growth of the baby.   Do not use any products that contain nicotine or tobacco, such as cigarettes and e-cigarettes. If you need help quitting, ask your health care provider. You may receive counseling support and other resources to help you quit.   Schedule a dentist appointment. At home, brush your teeth with a soft toothbrush and be gentle when you floss.  Contact a health care provider if:   You have dizziness.   You have mild pelvic cramps, pelvic pressure, or nagging pain in the abdominal area.   You have persistent nausea, vomiting, or diarrhea.   You have a bad smelling vaginal discharge.   You have pain when you urinate.   You notice increased swelling in your face, hands, legs, or ankles.   You are exposed to fifth disease or chickenpox.   You are exposed to German measles (rubella) and have never had it.  Get help right away if:   You have a fever.   You are leaking fluid from your vagina.   You have spotting or bleeding from your vagina.   You have severe abdominal cramping or pain.   You have rapid weight gain or loss.   You vomit blood or material that looks like coffee grounds.   You develop a severe headache.   You have shortness of breath.   You have any kind of trauma, such as from a fall or a car accident.  Summary   The first trimester of pregnancy is from week 1 until   the end of week 13 (months 1 through 3).   Your body goes through many changes during pregnancy. The changes vary from  woman to woman.   You will have routine prenatal visits. During those visits, your health care provider will examine you, discuss any test results you may have, and talk with you about how you are feeling.  This information is not intended to replace advice given to you by your health care provider. Make sure you discuss any questions you have with your health care provider.  Document Released: 03/14/2001 Document Revised: 03/01/2016 Document Reviewed: 03/01/2016  Elsevier Interactive Patient Education  2019 [employer]

## 2018-04-26 NOTE — Progress Notes (Signed)
Patient ID: Kristi Anderson, female   DOB: 1990/07/05, 28 y.o.   MRN: [id number] History of Present Illness: Kristi Anderson is a 28 year old white female, married, in for UPT, has missed a period and had +HPT. She had pre eclampsia with last pregnancy and was induced.  PCP is [place].    Current Medications, Allergies, Past Medical History, Past Surgical History, Family History and Social History were reviewed in [employer] record.     Review of Systems:  +missed periods, +HPT +cramping + lite spotting, did have sex  +Nausea  Decreased appetite    Physical Exam:BP 129/84 (BP Location: Left Arm, Patient Position: Sitting, Cuff Size: Normal)   Pulse 98   Ht 5\' 3"  (1.6 m)   Wt 138 lb (62.6 kg)   LMP 03/07/2018 (Exact Date)   BMI 24.45 kg/m   UPT +, about 7+1 week by LMP with EDD 12/12/2018. General:  Well developed, well nourished, no acute distress Skin:  Warm and dry Neck:  Midline trachea, normal thyroid, good ROM, no lymphadenopathy Lungs; Clear to auscultation bilaterally Cardiovascular: Regular rate and rhythm Abdomen:  Soft, non tender Psych:  No mood changes, alert and cooperative,seems happy Fall risk is low PHQ 2 score 0.  Impression:  1. Pregnancy test positive   2. Less than [redacted] weeks gestation of pregnancy   3. Encounter to determine fetal viability of pregnancy, single or unspecified fetus   4. Nausea   5. History of pre-eclampsia in prior pregnancy, currently pregnant   6. Spotting affecting pregnancy in first trimester      Plan: Eat often No sex for now Will check QHCG and progesterone  Meds ordered this encounter  Medications  . prenatal vitamin w/FE, FA (PRENATAL 1 + 1) 27-1 MG TABS tablet    Sig: Take 1 tablet by mouth daily at 12 noon.    Dispense:  30 each    Refill:  12    Order Specific Question:   Supervising Provider    Answer:   Despina Hidden, LUTHER H [[id number]]  . promethazine (PHENERGAN) 25 MG tablet    Sig: Take 1 tablet (25 mg  total) by mouth every 6 (six) hours as needed for nausea or vomiting.    Dispense:  30 tablet    Refill:  1    Order Specific Question:   Supervising Provider    Answer:   Duane Lope H [[id number]]  Return in 1 week for dating [place] and 3 weeks for New OB Review handout on First trimester and by [facility]  Request records from prior pregnancy

## 2018-04-27 LAB — BETA HCG QUANT (REF LAB): hCG Quant: 52661 m[IU]/mL

## 2018-04-27 LAB — PROGESTERONE: Progesterone: 11.5 ng/mL

## 2018-05-10 ENCOUNTER — Other Ambulatory Visit: Payer: BLUE CROSS/BLUE SHIELD

## 2018-05-17 ENCOUNTER — Ambulatory Visit: Payer: BLUE CROSS/BLUE SHIELD | Admitting: *Deleted

## 2018-05-17 ENCOUNTER — Encounter: Payer: BLUE CROSS/BLUE SHIELD | Admitting: Advanced Practice Midwife

## 2018-05-28 ENCOUNTER — Ambulatory Visit (INDEPENDENT_AMBULATORY_CARE_PROVIDER_SITE_OTHER): Payer: BLUE CROSS/BLUE SHIELD

## 2018-05-28 DIAGNOSIS — O3680X0 Pregnancy with inconclusive fetal viability, not applicable or unspecified: Secondary | ICD-10-CM

## 2018-05-28 NOTE — Progress Notes (Signed)
[place] 11+5 wks,crl 52.62 mm,fhr 176 bpm,normal ovaries bilat

## 2018-05-29 ENCOUNTER — Telehealth: Payer: Self-pay | Admitting: *Deleted

## 2018-05-29 NOTE — Telephone Encounter (Signed)
Pt states that she is out of phenergan and is unable to keep anything down. She is requesting a refill be sent to her pharmacy. Advised that I would send her request to a provider and she could check with her pharmacy later today. Pt verbalized understanding.

## 2018-05-30 ENCOUNTER — Other Ambulatory Visit: Payer: Self-pay | Admitting: Adult Health

## 2018-05-30 MED ORDER — PROMETHAZINE HCL 25 MG PO TABS: 25.0000 mg | ORAL_TABLET | Freq: Four times a day (QID) | ORAL | 1 refills | Status: DC | PRN

## 2018-05-30 NOTE — Progress Notes (Signed)
Refilled phenergan

## 2018-06-05 ENCOUNTER — Other Ambulatory Visit: Payer: Self-pay | Admitting: Obstetrics and Gynecology

## 2018-06-05 DIAGNOSIS — Z3682 Encounter for antenatal screening for nuchal translucency: Secondary | ICD-10-CM

## 2018-06-06 ENCOUNTER — Other Ambulatory Visit: Payer: Medicaid Other

## 2018-06-06 ENCOUNTER — Ambulatory Visit (INDEPENDENT_AMBULATORY_CARE_PROVIDER_SITE_OTHER): Payer: BLUE CROSS/BLUE SHIELD

## 2018-06-06 DIAGNOSIS — Z3A13 13 weeks gestation of pregnancy: Secondary | ICD-10-CM

## 2018-06-06 DIAGNOSIS — Z3482 Encounter for supervision of other normal pregnancy, second trimester: Secondary | ICD-10-CM

## 2018-06-06 DIAGNOSIS — Z3490 Encounter for supervision of normal pregnancy, unspecified, unspecified trimester: Secondary | ICD-10-CM

## 2018-06-06 DIAGNOSIS — Z3682 Encounter for antenatal screening for nuchal translucency: Secondary | ICD-10-CM

## 2018-06-06 DIAGNOSIS — O0990 Supervision of high risk pregnancy, unspecified, unspecified trimester: Secondary | ICD-10-CM | POA: Insufficient documentation

## 2018-06-06 DIAGNOSIS — Z1379 Encounter for other screening for genetic and chromosomal anomalies: Secondary | ICD-10-CM

## 2018-06-06 NOTE — Progress Notes (Signed)
[place] 13 wks,measurements c/w dates,crl 67.14 mm,fhr 164 bpm,posterior placenta gr 0,NB present,NT 1.6 mm,normal ovaries bilat

## 2018-06-07 ENCOUNTER — Ambulatory Visit (INDEPENDENT_AMBULATORY_CARE_PROVIDER_SITE_OTHER): Payer: BLUE CROSS/BLUE SHIELD | Admitting: Women's Health

## 2018-06-07 ENCOUNTER — Ambulatory Visit: Payer: BLUE CROSS/BLUE SHIELD | Admitting: *Deleted

## 2018-06-07 ENCOUNTER — Other Ambulatory Visit: Payer: Self-pay | Admitting: *Deleted

## 2018-06-07 ENCOUNTER — Other Ambulatory Visit: Payer: Self-pay

## 2018-06-07 ENCOUNTER — Encounter: Payer: Self-pay | Admitting: Women's Health

## 2018-06-07 ENCOUNTER — Other Ambulatory Visit (HOSPITAL_COMMUNITY)
Admission: RE | Admit: 2018-06-07 | Discharge: 2018-06-07 | Disposition: A | Discharge status: Home / Self Care | Payer: BLUE CROSS/BLUE SHIELD | Source: Ambulatory Visit | Attending: Advanced Practice Midwife | Admitting: Advanced Practice Midwife

## 2018-06-07 ENCOUNTER — Telehealth: Payer: Self-pay | Admitting: *Deleted

## 2018-06-07 VITALS — BP 113/74 | HR 93 | Wt 132.0 lb

## 2018-06-07 DIAGNOSIS — Z3A13 13 weeks gestation of pregnancy: Secondary | ICD-10-CM

## 2018-06-07 DIAGNOSIS — Z3482 Encounter for supervision of other normal pregnancy, second trimester: Secondary | ICD-10-CM

## 2018-06-07 DIAGNOSIS — O09299 Supervision of pregnancy with other poor reproductive or obstetric history, unspecified trimester: Secondary | ICD-10-CM

## 2018-06-07 DIAGNOSIS — Z23 Encounter for immunization: Secondary | ICD-10-CM

## 2018-06-07 DIAGNOSIS — O26899 Other specified pregnancy related conditions, unspecified trimester: Secondary | ICD-10-CM | POA: Insufficient documentation

## 2018-06-07 DIAGNOSIS — Z124 Encounter for screening for malignant neoplasm of cervix: Secondary | ICD-10-CM

## 2018-06-07 DIAGNOSIS — O09291 Supervision of pregnancy with other poor reproductive or obstetric history, first trimester: Secondary | ICD-10-CM

## 2018-06-07 DIAGNOSIS — Z6791 Unspecified blood type, Rh negative: Secondary | ICD-10-CM | POA: Insufficient documentation

## 2018-06-07 DIAGNOSIS — Z3481 Encounter for supervision of other normal pregnancy, first trimester: Secondary | ICD-10-CM

## 2018-06-07 DIAGNOSIS — Z331 Pregnant state, incidental: Secondary | ICD-10-CM

## 2018-06-07 DIAGNOSIS — Z1389 Encounter for screening for other disorder: Secondary | ICD-10-CM

## 2018-06-07 MED ORDER — DOXYLAMINE-PYRIDOXINE ER 20-20 MG PO TBCR: 1.0000 | EXTENDED_RELEASE_TABLET | Freq: Every day | ORAL | 8 refills | Status: DC

## 2018-06-07 NOTE — Telephone Encounter (Signed)
CMP added to labs drawn yesterday per [facility] request.

## 2018-06-07 NOTE — Progress Notes (Signed)
INITIAL OBSTETRICAL VISIT Patient name: Kristi Anderson MRN [id number]  Date of birth: 02-19-1991 Chief Complaint:   Initial Prenatal Visit (nausea, cough)  History of Present Illness:   Kristi Anderson is a 28 y.o. G25P1001 Caucasian female at [redacted]w[redacted]d by LMP c/w 11wk u/s, with an Estimated Date of Delivery: 12/12/18 being seen today for her initial obstetrical visit.   Her obstetrical history is significant for term SVB complicated by pre-e.   Today she reports nausea/vomiting- phenergan not helping.  Patient's last menstrual period was 03/07/2018 (exact date). Last pap >48yrs ago. Results were: normal Review of Systems:   Pertinent items are noted in HPI Denies cramping/contractions, leakage of fluid, vaginal bleeding, abnormal vaginal discharge w/ itching/odor/irritation, headaches, visual changes, shortness of breath, chest pain, abdominal pain, severe nausea/vomiting, or problems with urination or bowel movements unless otherwise stated above.  Pertinent History Reviewed:  Reviewed past medical,surgical, social, obstetrical and family history.  Reviewed problem list, medications and allergies. OB History  Gravida Para Term Preterm AB Living  2 1 1  0 0 1  SAB TAB Ectopic Multiple Live Births  0 0 0 0 1    # Outcome Date GA Lbr Len/2nd Weight Sex Delivery Anes PTL Lv  2 Current           1 Term 01/11/16 [redacted]w[redacted]d 09:23 / 01:59 6 lb 1 oz (2.75 kg) F Vag-Spont None N LIV     Birth Comments: wnl     Complications: Preeclampsia   Physical Assessment:   Vitals:   06/07/18 0932  BP: 113/74  Pulse: 93  Weight: 132 lb (59.9 kg)  Body mass index is 23.38 kg/m.       Physical Examination:  General appearance - well appearing, and in no distress  Mental status - alert, oriented to person, place, and time  Psych:  She has a normal mood and affect  Skin - warm and dry, normal color, no suspicious lesions noted  Chest - effort normal, all lung fields clear to auscultation  bilaterally  Heart - normal rate and regular rhythm  Abdomen - soft, nontender  Extremities:  No swelling or varicosities noted  Pelvic - VULVA: normal appearing vulva with no masses, tenderness or lesions  VAGINA: normal appearing vagina with normal color and discharge, no lesions  CERVIX: normal appearing cervix without discharge or lesions, no CMT  Thin prep pap is done w/  HR HPV cotesting  Fetal Heart Rate (bpm): 162 via doppler  No results found for this or any previous visit (from the past 24 hour(s)).  Assessment & Plan:  1) Low-Risk Pregnancy G2P1001 at [redacted]w[redacted]d with an Estimated Date of Delivery: 12/12/18   2) Initial OB visit  3) Nausea/vomiting> rx Bonjesta, if not helping let us know  4) H/O pre-e> baseline labs (will see if can add CMP to labs from yesterday-note routed to [facility]), add P:C ratio today; start 162mg  ASA today  Meds:  Meds ordered this encounter  Medications  . Doxylamine-Pyridoxine ER (BONJESTA) 20-20 MG TBCR    Sig: Take 1 tablet by mouth at bedtime. Can add 1 tablet in the morning if needed for nausea and vomiting    Dispense:  60 tablet    Refill:  8    Order Specific Question:   Supervising Provider    Answer:   Lazaro Arms [[id number]]    Initial labs obtained yesterday when doing nt/it Continue prenatal vitamins Reviewed n/v relief measures and warning s/s to report Reviewed recommended weight  gain based on pre-gravid BMI Encouraged well-balanced diet Genetic Screening discussed: requests maternit21, nt/it, 1st IT/NT yesterday Cystic fibrosis, SMA, Fragile X screening discussed requested Ultrasound discussed; fetal survey: requested CCNC completed Flu shot today  Follow-up: Return in about 3 weeks (around 06/28/2018) for LROB, 2nd IT.   Orders Placed This Encounter  Procedures  . Urine Culture  . GC/Chlamydia Probe Amp  . Flu Vaccine QUAD 36+ mos IM  . Urinalysis, Routine w reflex microscopic  . Pain Management Screening Profile (10S)  .  Protein / creatinine ratio, urine  . POC Urinalysis Dipstick OB    Cheral Marker CNM, [facility] 06/07/2018 11:20 AM

## 2018-06-07 NOTE — Patient Instructions (Addendum)
Kristi Anderson, I greatly value your feedback.  If you receive a survey following your visit with [place] today, we appreciate you taking the time to fill it out.  Thanks, Joellyn Haff, CNM, [facility]  [facility] HAS MOVED!!! It is now [facility] at [facility] ([address] [place], [place] [address]) Entrance located off of [address] Free 24/7 valet parking    Begin taking 162mg  (two 81mg  tablets) baby aspirin daily to decrease risk of preeclampsia during pregnancy    Nausea & Vomiting  Have saltine crackers or pretzels by your bed and eat a few bites before you raise your head out of bed in the morning  Eat small frequent meals throughout the day instead of large meals  Drink plenty of fluids throughout the day to stay hydrated, just don't drink a lot of fluids with your meals.  This can make your stomach fill up faster making you feel sick  Do not brush your teeth right after you eat  Products with real ginger are good for nausea, like ginger ale and ginger hard candy Make sure it says made with real ginger!  Sucking on sour candy like lemon heads is also good for nausea  If your prenatal vitamins make you nauseated, take them at night so you will sleep through the nausea  Sea Bands  If you feel like you need medicine for the nausea & vomiting please let us know  If you are unable to keep any fluids or food down please let us know   Constipation  Drink plenty of fluid, preferably water, throughout the day  Eat foods high in fiber such as fruits, vegetables, and grains  Exercise, such as walking, is a good way to keep your bowels regular  Drink warm fluids, especially warm prune juice, or decaf coffee  Eat a 1/2 cup of real oatmeal (not instant), 1/2 cup applesauce, and 1/2-1 cup warm prune juice every day  If needed, you may take Colace (docusate sodium) stool softener once or twice a day to help keep the stool soft. If you are pregnant, wait until  you are out of your first trimester (12-14 weeks of pregnancy)  If you still are having problems with constipation, you may take Miralax once daily as needed to help keep your bowels regular.  If you are pregnant, wait until you are out of your first trimester (12-14 weeks of pregnancy)   First Trimester of Pregnancy The first trimester of pregnancy is from week 1 until the end of week 12 (months 1 through 3). A week after a sperm fertilizes an egg, the egg will implant on the wall of the uterus. This embryo will begin to develop into a baby. Genes from you and your partner are forming the baby. The female genes determine whether the baby is a boy or a girl. At 6-8 weeks, the eyes and face are formed, and the heartbeat can be seen on ultrasound. At the end of 12 weeks, all the baby's organs are formed.  Now that you are pregnant, you will want to do everything you can to have a healthy baby. Two of the most important things are to get good prenatal care and to follow your health care provider's instructions. Prenatal care is all the medical care you receive before the baby's birth. This care will help prevent, find, and treat any problems during the pregnancy and childbirth. BODY CHANGES Your body goes through many changes during pregnancy. The  changes vary from woman to woman.   You may gain or lose a couple of pounds at first.  You may feel sick to your stomach (nauseous) and throw up (vomit). If the vomiting is uncontrollable, call your health care provider.  You may tire easily.  You may develop headaches that can be relieved by medicines approved by your health care provider.  You may urinate more often. Painful urination may mean you have a bladder infection.  You may develop heartburn as a result of your pregnancy.  You may develop constipation because certain hormones are causing the muscles that push waste through your intestines to slow down.  You may develop hemorrhoids or swollen,  bulging veins (varicose veins).  Your breasts may begin to grow larger and become tender. Your nipples may stick out more, and the tissue that surrounds them (areola) may become darker.  Your gums may bleed and may be sensitive to brushing and flossing.  Dark spots or blotches (chloasma, mask of pregnancy) may develop on your face. This will likely fade after the baby is born.  Your menstrual periods will stop.  You may have a loss of appetite.  You may develop cravings for certain kinds of food.  You may have changes in your emotions from day to day, such as being excited to be pregnant or being concerned that something may go wrong with the pregnancy and baby.  You may have more vivid and strange dreams.  You may have changes in your hair. These can include thickening of your hair, rapid growth, and changes in texture. Some women also have hair loss during or after pregnancy, or hair that feels dry or thin. Your hair will most likely return to normal after your baby is born. WHAT TO EXPECT AT YOUR PRENATAL VISITS During a routine prenatal visit:  You will be weighed to make sure you and the baby are growing normally.  Your blood pressure will be taken.  Your abdomen will be measured to track your baby's growth.  The fetal heartbeat will be listened to starting around week 10 or 12 of your pregnancy.  Test results from any previous visits will be discussed. Your health care provider may ask you:  How you are feeling.  If you are feeling the baby move.  If you have had any abnormal symptoms, such as leaking fluid, bleeding, severe headaches, or abdominal cramping.  If you have any questions. Other tests that may be performed during your first trimester include:  Blood tests to find your blood type and to check for the presence of any previous infections. They will also be used to check for low iron levels (anemia) and Rh antibodies. Later in the pregnancy, blood tests for  diabetes will be done along with other tests if problems develop.  Urine tests to check for infections, diabetes, or protein in the urine.  An ultrasound to confirm the proper growth and development of the baby.  An amniocentesis to check for possible genetic problems.  Fetal screens for spina bifida and Down syndrome.  You may need other tests to make sure you and the baby are doing well. HOME CARE INSTRUCTIONS  Medicines  Follow your health care provider's instructions regarding medicine use. Specific medicines may be either safe or unsafe to take during pregnancy.  Take your prenatal vitamins as directed.  If you develop constipation, try taking a stool softener if your health care provider approves. Diet  Eat regular, well-balanced meals. Choose a variety  of foods, such as meat or vegetable-based protein, fish, milk and low-fat dairy products, vegetables, fruits, and whole grain breads and cereals. Your health care provider will help you determine the amount of weight gain that is right for you.  Avoid raw meat and uncooked cheese. These carry germs that can cause birth defects in the baby.  Eating four or five small meals rather than three large meals a day may help relieve nausea and vomiting. If you start to feel nauseous, eating a few soda crackers can be helpful. Drinking liquids between meals instead of during meals also seems to help nausea and vomiting.  If you develop constipation, eat more high-fiber foods, such as fresh vegetables or fruit and whole grains. Drink enough fluids to keep your urine clear or pale yellow. Activity and Exercise  Exercise only as directed by your health care provider. Exercising will help you:  Control your weight.  Stay in shape.  Be prepared for labor and delivery.  Experiencing pain or cramping in the lower abdomen or low back is a good sign that you should stop exercising. Check with your health care provider before continuing normal  exercises.  Try to avoid standing for long periods of time. Move your legs often if you must stand in one place for a long time.  Avoid heavy lifting.  Wear low-heeled shoes, and practice good posture.  You may continue to have sex unless your health care provider directs you otherwise. Relief of Pain or Discomfort  Wear a good support bra for breast tenderness.   Take warm sitz baths to soothe any pain or discomfort caused by hemorrhoids. Use hemorrhoid cream if your health care provider approves.   Rest with your legs elevated if you have leg cramps or low back pain.  If you develop varicose veins in your legs, wear support hose. Elevate your feet for 15 minutes, 3-4 times a day. Limit salt in your diet. Prenatal Care  Schedule your prenatal visits by the twelfth week of pregnancy. They are usually scheduled monthly at first, then more often in the last 2 months before delivery.  Write down your questions. Take them to your prenatal visits.  Keep all your prenatal visits as directed by your health care provider. Safety  Wear your seat belt at all times when driving.  Make a list of emergency phone numbers, including numbers for family, friends, the hospital, and police and fire departments. General Tips  Ask your health care provider for a referral to a local prenatal education class. Begin classes no later than at the beginning of month 6 of your pregnancy.  Ask for help if you have counseling or nutritional needs during pregnancy. Your health care provider can offer advice or refer you to specialists for help with various needs.  Do not use hot tubs, steam rooms, or saunas.  Do not douche or use tampons or scented sanitary pads.  Do not cross your legs for long periods of time.  Avoid cat litter boxes and soil used by cats. These carry germs that can cause birth defects in the baby and possibly loss of the fetus by miscarriage or stillbirth.  Avoid all smoking,  herbs, alcohol, and medicines not prescribed by your health care provider. Chemicals in these affect the formation and growth of the baby.  Schedule a dentist appointment. At home, brush your teeth with a soft toothbrush and be gentle when you floss. SEEK MEDICAL CARE IF:   You have dizziness.  You  have mild pelvic cramps, pelvic pressure, or nagging pain in the abdominal area.  You have persistent nausea, vomiting, or diarrhea.  You have a bad smelling vaginal discharge.  You have pain with urination.  You notice increased swelling in your face, hands, legs, or ankles. SEEK IMMEDIATE MEDICAL CARE IF:   You have a fever.  You are leaking fluid from your vagina.  You have spotting or bleeding from your vagina.  You have severe abdominal cramping or pain.  You have rapid weight gain or loss.  You vomit blood or material that looks like coffee grounds.  You are exposed to [place] measles and have never had them.  You are exposed to fifth disease or chickenpox.  You develop a severe headache.  You have shortness of breath.  You have any kind of trauma, such as from a fall or a car accident. Document Released: 03/14/2001 Document Revised: 08/04/2013 Document Reviewed: 01/28/2013 [facility] Patient Information 2015 [place], [place]. This information is not intended to replace advice given to you by your health care provider. Make sure you discuss any questions you have with your health care provider.

## 2018-06-08 LAB — URINALYSIS, ROUTINE W REFLEX MICROSCOPIC
Bilirubin, UA: NEGATIVE
Glucose, UA: NEGATIVE
Ketones, UA: NEGATIVE
Nitrite, UA: NEGATIVE
RBC, UA: NEGATIVE
Specific Gravity, UA: 1.029 (ref 1.005–1.030)
Urobilinogen, Ur: 1 mg/dL (ref 0.2–1.0)
pH, UA: 6 (ref 5.0–7.5)

## 2018-06-08 LAB — MICROSCOPIC EXAMINATION
Casts: NONE SEEN /lpf
Epithelial Cells (non renal): >10 /hpf — AB (ref 0–10)
RBC, UA: NONE SEEN /hpf (ref 0–2)

## 2018-06-08 LAB — PROTEIN / CREATININE RATIO, URINE
Creatinine, Urine: 286.7 mg/dL
Protein, Ur: 33.6 mg/dL
Protein/Creat Ratio: 117 mg/g creat (ref 0–200)

## 2018-06-08 LAB — SPECIMEN STATUS REPORT

## 2018-06-09 LAB — URINE CULTURE

## 2018-06-10 ENCOUNTER — Encounter: Payer: Self-pay | Admitting: Women's Health

## 2018-06-11 ENCOUNTER — Telehealth: Payer: Self-pay | Admitting: *Deleted

## 2018-06-11 ENCOUNTER — Telehealth: Payer: Self-pay | Admitting: Obstetrics & Gynecology

## 2018-06-11 LAB — COMPREHENSIVE METABOLIC PANEL
ALT: 47 IU/L — ABNORMAL HIGH (ref 0–32)
AST: 32 IU/L (ref 0–40)
Albumin/Globulin Ratio: 1.7 (ref 1.2–2.2)
Albumin: 4.3 g/dL (ref 3.9–5.0)
Alkaline Phosphatase: 49 IU/L (ref 39–117)
BUN/Creatinine Ratio: 11 (ref 9–23)
BUN: 6 mg/dL (ref 6–20)
Bilirubin Total: 0.3 mg/dL (ref 0.0–1.2)
CO2: 19 mmol/L — ABNORMAL LOW (ref 20–29)
Calcium: 9.3 mg/dL (ref 8.7–10.2)
Chloride: 100 mmol/L (ref 96–106)
Creatinine, Ser: 0.55 mg/dL — ABNORMAL LOW (ref 0.57–1.00)
GFR calc Af Amer: 146 mL/min/{1.73_m2} (ref >59)
GFR calc non Af Amer: 126 mL/min/{1.73_m2} (ref >59)
Globulin, Total: 2.5 g/dL (ref 1.5–4.5)
Glucose: 95 mg/dL (ref 65–99)
Potassium: 4.3 mmol/L (ref 3.5–5.2)
Sodium: 138 mmol/L (ref 134–144)
Total Protein: 6.8 g/dL (ref 6.0–8.5)

## 2018-06-11 LAB — PMP SCREEN PROFILE (10S), URINE
Amphetamine Scrn, Ur: NEGATIVE ng/mL
BARBITURATE SCREEN URINE: NEGATIVE ng/mL
BENZODIAZEPINE SCREEN, URINE: NEGATIVE ng/mL
CANNABINOIDS UR QL SCN: NEGATIVE ng/mL
Cocaine (Metab) Scrn, Ur: NEGATIVE ng/mL
Creatinine(Crt), U: 318.5 mg/dL — ABNORMAL HIGH (ref 20.0–300.0)
Methadone Screen, Urine: NEGATIVE ng/mL
OXYCODONE+OXYMORPHONE UR QL SCN: NEGATIVE ng/mL
Opiate Scrn, Ur: NEGATIVE ng/mL
Ph of Urine: 6.5 (ref 4.5–8.9)
Phencyclidine Qn, Ur: NEGATIVE ng/mL
Propoxyphene Scrn, Ur: NEGATIVE ng/mL

## 2018-06-11 LAB — CYTOLOGY - PAP
Chlamydia: NEGATIVE
Diagnosis: NEGATIVE
HPV: NOT DETECTED
Neisseria Gonorrhea: NEGATIVE

## 2018-06-11 LAB — GC/CHLAMYDIA PROBE AMP
Chlamydia trachomatis, NAA: NEGATIVE
Neisseria gonorrhoeae by PCR: NEGATIVE

## 2018-06-11 LAB — SPECIMEN STATUS REPORT

## 2018-06-11 NOTE — Telephone Encounter (Signed)
Patient called stating that she would like a call back from the nurse, patient did not states the reason why. Please contact pt

## 2018-06-11 NOTE — Telephone Encounter (Signed)
Patient called to verify the amount of aspirin she is to take.  Informed she should take 162mg , 2 baby aspirin.  Verbalized understanding.

## 2018-06-14 ENCOUNTER — Other Ambulatory Visit: Payer: Self-pay | Admitting: Adult Health

## 2018-06-21 LAB — INHERITEST CORE(CF97,SMA,FRAX)

## 2018-06-21 LAB — OBSTETRIC PANEL, INCLUDING HIV
Antibody Screen: NEGATIVE
Basophils Absolute: 0 10*3/uL (ref 0.0–0.2)
Basos: 0 %
EOS (ABSOLUTE): 0.1 10*3/uL (ref 0.0–0.4)
Eos: 1 %
HIV Screen 4th Generation wRfx: NONREACTIVE
Hematocrit: 41.7 % (ref 34.0–46.6)
Hemoglobin: 14 g/dL (ref 11.1–15.9)
Hepatitis B Surface Ag: NEGATIVE
Immature Grans (Abs): 0 10*3/uL (ref 0.0–0.1)
Immature Granulocytes: 0 %
Lymphocytes Absolute: 1.8 10*3/uL (ref 0.7–3.1)
Lymphs: 24 %
MCH: 29.2 pg (ref 26.6–33.0)
MCHC: 33.6 g/dL (ref 31.5–35.7)
MCV: 87 fL (ref 79–97)
Monocytes Absolute: 0.4 10*3/uL (ref 0.1–0.9)
Monocytes: 5 %
Neutrophils Absolute: 5.3 10*3/uL (ref 1.4–7.0)
Neutrophils: 70 %
Platelets: 258 10*3/uL (ref 150–450)
RBC: 4.8 x10E6/uL (ref 3.77–5.28)
RDW: 12.8 % (ref 11.7–15.4)
RPR Ser Ql: NONREACTIVE
Rh Factor: NEGATIVE
Rubella Antibodies, IGG: 6.15 index (ref >0.99)
WBC: 7.6 10*3/uL (ref 3.4–10.8)

## 2018-06-21 LAB — INTEGRATED 1
Crown Rump Length: 67.1 mm
Gest. Age on Collection Date: 12.9 weeks
Maternal Age at EDD: 30.6 yr
Nuchal Translucency (NT): 1.6 mm
Number of Fetuses: 1
PAPP-A Value: 1244.6 ng/mL
Weight: 132 [lb_av]

## 2018-06-21 LAB — MATERNIT 21 PLUS CORE, BLOOD
Chromosome 13: NEGATIVE
Chromosome 18: NEGATIVE
Chromosome 21: NEGATIVE
Fetal Fraction: 5
Y Chromosome: DETECTED

## 2018-06-21 LAB — SICKLE CELL SCREEN: Sickle Cell Screen: NEGATIVE

## 2018-06-27 ENCOUNTER — Telehealth: Payer: Self-pay | Admitting: *Deleted

## 2018-06-27 NOTE — Telephone Encounter (Signed)
Patient informed that we are not allowing visitors or children to come to appointments at this time. Patient denies any contact with anyone suspected or confirmed of having COVID-19. Pt denies fever, cough, sob, muscle pain, diarrhea, rash, vomiting, abdominal pain, red eye, weakness, bruising or bleeding, joint pain or severe headache.  

## 2018-06-28 ENCOUNTER — Other Ambulatory Visit: Payer: Self-pay

## 2018-06-28 ENCOUNTER — Encounter: Payer: Self-pay | Admitting: Obstetrics & Gynecology

## 2018-06-28 ENCOUNTER — Ambulatory Visit (INDEPENDENT_AMBULATORY_CARE_PROVIDER_SITE_OTHER): Payer: BLUE CROSS/BLUE SHIELD | Admitting: Obstetrics & Gynecology

## 2018-06-28 VITALS — BP 121/77 | HR 98 | Wt 138.0 lb

## 2018-06-28 DIAGNOSIS — Z331 Pregnant state, incidental: Secondary | ICD-10-CM

## 2018-06-28 DIAGNOSIS — Z1389 Encounter for screening for other disorder: Secondary | ICD-10-CM

## 2018-06-28 DIAGNOSIS — Z3482 Encounter for supervision of other normal pregnancy, second trimester: Secondary | ICD-10-CM

## 2018-06-28 DIAGNOSIS — Z1379 Encounter for other screening for genetic and chromosomal anomalies: Secondary | ICD-10-CM

## 2018-06-28 DIAGNOSIS — Z3A16 16 weeks gestation of pregnancy: Secondary | ICD-10-CM

## 2018-06-28 LAB — POCT URINALYSIS DIPSTICK OB
Blood, UA: NEGATIVE
Glucose, UA: NEGATIVE
Ketones, UA: NEGATIVE
Leukocytes, UA: NEGATIVE
Nitrite, UA: NEGATIVE

## 2018-06-28 NOTE — Progress Notes (Signed)
   LOW-RISK PREGNANCY VISIT Patient name: Kristi Anderson MRN [id number]  Date of birth: Aug 16, 1990 Chief Complaint:   Routine Prenatal Visit (2nd IT)  History of Present Illness:   Kristi Anderson is a 28 y.o. G21P1001 female at [redacted]w[redacted]d with an Estimated Date of Delivery: 12/12/18 being seen today for ongoing management of a low-risk pregnancy.  Today she reports no complaints.  . Vag. Bleeding: None.  Movement: Absent. denies leaking of fluid. Review of Systems:   Pertinent items are noted in HPI Denies abnormal vaginal discharge w/ itching/odor/irritation, headaches, visual changes, shortness of breath, chest pain, abdominal pain, severe nausea/vomiting, or problems with urination or bowel movements unless otherwise stated above. Pertinent History Reviewed:  Reviewed past medical,surgical, social, obstetrical and family history.  Reviewed problem list, medications and allergies. Physical Assessment:   Vitals:   06/28/18 1102  BP: 121/77  Pulse: 98  Weight: 138 lb (62.6 kg)  Body mass index is 24.45 kg/m.        Physical Examination:   General appearance: Well appearing, and in no distress  Mental status: Alert, oriented to person, place, and time  Skin: Warm & dry  Cardiovascular: Normal heart rate noted  Respiratory: Normal respiratory effort, no distress  Abdomen: Soft, gravid, nontender  Pelvic: Cervical exam deferred         Extremities: Edema: None  Fetal Status:     Movement: Absent    Results for orders placed or performed in visit on 06/28/18 (from the past 24 hour(s))  POC Urinalysis Dipstick OB   Collection Time: 06/28/18 11:03 AM  Result Value Ref Range   Color, UA     Clarity, UA     Glucose, UA Negative Negative   Bilirubin, UA     Ketones, UA neg    Spec Grav, UA     Blood, UA neg    pH, UA     POC,PROTEIN,UA Small (1+) Negative, Trace, Small (1+), Moderate (2+), Large (3+), 4+   Urobilinogen, UA     Nitrite, UA neg    Leukocytes, UA Negative Negative    Appearance     Odor      Assessment & Plan:  1) Low-risk pregnancy G2P1001 at [redacted]w[redacted]d with an Estimated Date of Delivery: 12/12/18   2) Hx of pre eclampsia, on 162 mg ASA daily   Meds: No orders of the defined types were placed in this encounter.  Labs/procedures today: 2nd IT  Plan:  Continue routine obstetrical care   Reviewed:  labor symptoms and general obstetric precautions including but not limited to vaginal bleeding, contractions, leaking of fluid and fetal movement were reviewed in detail with the patient.  All questions were answered  Follow-up: Return in about 4 weeks (around 07/26/2018) for 20 week sono, LROB.  Orders Placed This Encounter  Procedures  . US OB Comp + 14 Wk  . INTEGRATED 2  . POC Urinalysis Dipstick OB   Amaryllis Dyke Salvatore Shear 06/28/2018 11:14 AM

## 2018-07-01 LAB — INTEGRATED 2
AFP MoM: 2.3
Alpha-Fetoprotein: 77.9 ng/mL
Crown Rump Length: 67.1 mm
DIA MoM: 1.1
DIA Value: 198.8 pg/mL
Estriol, Unconjugated: 1.22 ng/mL
Gest. Age on Collection Date: 12.9 weeks
Gestational Age: 16 weeks
Maternal Age at EDD: 30.6 yr
Nuchal Translucency (NT): 1.6 mm
Nuchal Translucency MoM: 1.06
Number of Fetuses: 1
PAPP-A MoM: 0.98
PAPP-A Value: 1244.6 ng/mL
Test Results:: NEGATIVE
Weight: 132 [lb_av]
Weight: 132 [lb_av]
hCG MoM: 1.72
hCG Value: 68.3 IU/mL
uE3 MoM: 1.36

## 2018-07-25 ENCOUNTER — Other Ambulatory Visit: Payer: Self-pay | Admitting: Obstetrics & Gynecology

## 2018-07-25 ENCOUNTER — Encounter: Payer: Self-pay | Admitting: *Deleted

## 2018-07-25 DIAGNOSIS — Z363 Encounter for antenatal screening for malformations: Secondary | ICD-10-CM

## 2018-07-25 DIAGNOSIS — Z3482 Encounter for supervision of other normal pregnancy, second trimester: Secondary | ICD-10-CM

## 2018-07-26 ENCOUNTER — Ambulatory Visit (INDEPENDENT_AMBULATORY_CARE_PROVIDER_SITE_OTHER): Payer: BLUE CROSS/BLUE SHIELD | Admitting: Obstetrics & Gynecology

## 2018-07-26 ENCOUNTER — Encounter: Payer: Self-pay | Admitting: Obstetrics & Gynecology

## 2018-07-26 ENCOUNTER — Other Ambulatory Visit: Payer: Self-pay

## 2018-07-26 ENCOUNTER — Ambulatory Visit (INDEPENDENT_AMBULATORY_CARE_PROVIDER_SITE_OTHER): Payer: BLUE CROSS/BLUE SHIELD

## 2018-07-26 VITALS — BP 112/73 | HR 91 | Wt 140.0 lb

## 2018-07-26 DIAGNOSIS — Z3A20 20 weeks gestation of pregnancy: Secondary | ICD-10-CM | POA: Diagnosis not present

## 2018-07-26 DIAGNOSIS — Z3482 Encounter for supervision of other normal pregnancy, second trimester: Secondary | ICD-10-CM

## 2018-07-26 DIAGNOSIS — Z363 Encounter for antenatal screening for malformations: Secondary | ICD-10-CM

## 2018-07-26 DIAGNOSIS — Z331 Pregnant state, incidental: Secondary | ICD-10-CM

## 2018-07-26 DIAGNOSIS — Z1389 Encounter for screening for other disorder: Secondary | ICD-10-CM

## 2018-07-26 LAB — POCT URINALYSIS DIPSTICK OB
Blood, UA: NEGATIVE
Glucose, UA: NEGATIVE
Ketones, UA: NEGATIVE
Leukocytes, UA: NEGATIVE
Nitrite, UA: NEGATIVE

## 2018-07-26 NOTE — Progress Notes (Signed)
[place] 20+1 wks,breech,posterior placenta gr 0,cx 3.5 cm,svp of fluid 5 cm,normal ovaries bilat,fhr 150 bpm,efw 321 g 33%,anatomy complete,no obvious abnormalities

## 2018-07-26 NOTE — Progress Notes (Signed)
   LOW-RISK PREGNANCY VISIT Patient name: Kristi Anderson MRN [id number]  Date of birth: 1990-10-30 Chief Complaint:   Routine Prenatal Visit (u/s)  History of Present Illness:   Kristi Anderson is a 28 y.o. G68P1001 female at [redacted]w[redacted]d with an Estimated Date of Delivery: 12/12/18 being seen today for ongoing management of a low-risk pregnancy.  Today she reports no complaints.  .  .  Movement: Present. denies leaking of fluid. Review of Systems:   Pertinent items are noted in HPI Denies abnormal vaginal discharge w/ itching/odor/irritation, headaches, visual changes, shortness of breath, chest pain, abdominal pain, severe nausea/vomiting, or problems with urination or bowel movements unless otherwise stated above. Pertinent History Reviewed:  Reviewed past medical,surgical, social, obstetrical and family history.  Reviewed problem list, medications and allergies. Physical Assessment:   Vitals:   07/26/18 1019  BP: 112/73  Pulse: 91  Weight: 140 lb (63.5 kg)  Body mass index is 24.8 kg/m.        Physical Examination:   General appearance: Well appearing, and in no distress  Mental status: Alert, oriented to person, place, and time  Skin: Warm & dry  Cardiovascular: Normal heart rate noted  Respiratory: Normal respiratory effort, no distress  Abdomen: Soft, gravid, nontender  Pelvic: Cervical exam deferred         Extremities: Edema: None  Fetal Status: Fetal Heart Rate (bpm): 150   Movement: Present    Results for orders placed or performed in visit on 07/26/18 (from the past 24 hour(s))  POC Urinalysis Dipstick OB   Collection Time: 07/26/18 10:25 AM  Result Value Ref Range   Color, UA     Clarity, UA     Glucose, UA Negative Negative   Bilirubin, UA     Ketones, UA neg    Spec Grav, UA     Blood, UA neg    pH, UA     POC,PROTEIN,UA Trace Negative, Trace, Small (1+), Moderate (2+), Large (3+), 4+   Urobilinogen, UA     Nitrite, UA neg    Leukocytes, UA Negative Negative    Appearance     Odor      Assessment & Plan:  1) Low-risk pregnancy G2P1001 at [redacted]w[redacted]d with an Estimated Date of Delivery: 12/12/18      Meds: No orders of the defined types were placed in this encounter.  Labs/procedures today: sonogram is normal  Plan:  Continue routine obstetrical care   Reviewed:  labor symptoms and general obstetric precautions including but not limited to vaginal bleeding, contractions, leaking of fluid and fetal movement were reviewed in detail with the patient.  All questions were answered  Follow-up: Return in about 4 weeks (around 08/23/2018) for 4 weeks Webex visit, 8 weeks PN2 visit.  Orders Placed This Encounter  Procedures  . POC Urinalysis Dipstick OB   Lazaro Arms  07/26/2018 10:34 AM

## 2018-08-23 ENCOUNTER — Ambulatory Visit (INDEPENDENT_AMBULATORY_CARE_PROVIDER_SITE_OTHER): Payer: BLUE CROSS/BLUE SHIELD | Admitting: Obstetrics & Gynecology

## 2018-08-23 ENCOUNTER — Other Ambulatory Visit: Payer: Self-pay

## 2018-08-23 ENCOUNTER — Encounter: Payer: Self-pay | Admitting: Obstetrics & Gynecology

## 2018-08-23 VITALS — BP 106/60 | HR 97 | Wt 145.0 lb

## 2018-08-23 DIAGNOSIS — Z3A24 24 weeks gestation of pregnancy: Secondary | ICD-10-CM

## 2018-08-23 DIAGNOSIS — Z3482 Encounter for supervision of other normal pregnancy, second trimester: Secondary | ICD-10-CM

## 2018-08-23 NOTE — Progress Notes (Signed)
Patient ID: Kristi Anderson, female   DOB: 07-08-1990, 28 y.o.   MRN: [id number]   TELEHEALTH VIRTUAL OBSTETRICS VISIT ENCOUNTER NOTE  I connected with Kristi Anderson on 08/23/18 at 10:30 AM EDT by telephone at home and verified that I am speaking with the correct person using two identifiers.   I discussed the limitations, risks, security and privacy concerns of performing an evaluation and management service by telephone and the availability of in person appointments. I also discussed with the patient that there may be a patient responsible charge related to this service. The patient expressed understanding and agreed to proceed.  Subjective:  Kristi Anderson is a 28 y.o. G2P1001 at [redacted]w[redacted]d being followed for ongoing prenatal care.  She is currently monitored for the following issues for this low-risk pregnancy and has Post traumatic stress disorder; Vitamin D deficiency; Hx of preeclampsia, prior pregnancy, currently pregnant; Nausea; Supervision of normal pregnancy; and Rh negative state in antepartum period on their problem list.  Patient reports no complaints. Reports fetal movement. Denies any contractions, bleeding or leaking of fluid.   The following portions of the patient's history were reviewed and updated as appropriate: allergies, current medications, past family history, past medical history, past social history, past surgical history and problem list.   Objective:   General:  Alert, oriented and cooperative.   Mental Status: Normal mood and affect perceived. Normal judgment and thought content.  Rest of physical exam deferred due to type of encounter  Assessment and Plan:  Pregnancy: G2P1001 at [redacted]w[redacted]d There are no diagnoses linked to this encounter. Preterm labor symptoms and general obstetric precautions including but not limited to vaginal bleeding, contractions, leaking of fluid and fetal movement were reviewed in detail with the patient.  I discussed the assessment and  treatment plan with the patient. The patient was provided an opportunity to ask questions and all were answered. The patient agreed with the plan and demonstrated an understanding of the instructions. The patient was advised to call back or seek an in-person office evaluation/go to [facility] at [facility] for any urgent or concerning symptoms. Please refer to After Visit Summary for other counseling recommendations.   I provided 11 minutes of non-face-to-face time during this encounter.  Return for keep scheduled appts.  Future Appointments  Date Time Provider Department Center  09/20/2018  9:00 AM [facility] LAB [facility]  09/20/2018  9:30 AM , Amaryllis Dyke, MD [facility]    Lazaro Arms, MD Center for [facility], [facility]

## 2018-09-20 ENCOUNTER — Other Ambulatory Visit: Payer: BLUE CROSS/BLUE SHIELD

## 2018-09-20 ENCOUNTER — Encounter: Payer: BLUE CROSS/BLUE SHIELD | Admitting: Obstetrics & Gynecology

## 2018-10-07 ENCOUNTER — Other Ambulatory Visit: Payer: Self-pay

## 2018-10-07 ENCOUNTER — Encounter: Payer: Self-pay | Admitting: Obstetrics & Gynecology

## 2018-10-07 ENCOUNTER — Ambulatory Visit (INDEPENDENT_AMBULATORY_CARE_PROVIDER_SITE_OTHER): Payer: Medicaid Other | Admitting: Obstetrics & Gynecology

## 2018-10-07 ENCOUNTER — Other Ambulatory Visit: Payer: Medicaid Other

## 2018-10-07 VITALS — BP 113/70 | HR 106 | Wt 160.0 lb

## 2018-10-07 DIAGNOSIS — Z3483 Encounter for supervision of other normal pregnancy, third trimester: Secondary | ICD-10-CM

## 2018-10-07 DIAGNOSIS — Z3A30 30 weeks gestation of pregnancy: Secondary | ICD-10-CM

## 2018-10-07 DIAGNOSIS — Z331 Pregnant state, incidental: Secondary | ICD-10-CM

## 2018-10-07 DIAGNOSIS — Z1389 Encounter for screening for other disorder: Secondary | ICD-10-CM

## 2018-10-07 DIAGNOSIS — Z131 Encounter for screening for diabetes mellitus: Secondary | ICD-10-CM

## 2018-10-07 LAB — POCT URINALYSIS DIPSTICK OB
Glucose, UA: NEGATIVE
Ketones, UA: NEGATIVE
Nitrite, UA: NEGATIVE
POC,PROTEIN,UA: NEGATIVE

## 2018-10-07 NOTE — Progress Notes (Signed)
   LOW-RISK PREGNANCY VISIT Patient name: Kristi Anderson MRN [id number]  Date of birth: 05-Feb-1991 Chief Complaint:   Routine Prenatal Visit (PN2)  History of Present Illness:   Kristi Anderson is a 28 y.o. G45P1001 female at [redacted]w[redacted]d with an Estimated Date of Delivery: 12/12/18 being seen today for ongoing management of a low-risk pregnancy.  Today she reports no complaints. Contractions: Not present. Vag. Bleeding: None.  Movement: Present. denies leaking of fluid. Review of Systems:   Pertinent items are noted in HPI Denies abnormal vaginal discharge w/ itching/odor/irritation, headaches, visual changes, shortness of breath, chest pain, abdominal pain, severe nausea/vomiting, or problems with urination or bowel movements unless otherwise stated above. Pertinent History Reviewed:  Reviewed past medical,surgical, social, obstetrical and family history.  Reviewed problem list, medications and allergies. Physical Assessment:   Vitals:   10/07/18 0923  BP: 113/70  Pulse: (!) 106  Weight: 160 lb (72.6 kg)  Body mass index is 28.34 kg/m.        Physical Examination:   General appearance: Well appearing, and in no distress  Mental status: Alert, oriented to person, place, and time  Skin: Warm & dry  Cardiovascular: Normal heart rate noted  Respiratory: Normal respiratory effort, no distress  Abdomen: Soft, gravid, nontender  Pelvic: Cervical exam deferred         Extremities: Edema: Trace  Fetal Status: Fetal Heart Rate (bpm): 152 Fundal Height: 31 cm Movement: Present    Results for orders placed or performed in visit on 10/07/18 (from the past 24 hour(s))  POC Urinalysis Dipstick OB   Collection Time: 10/07/18  9:24 AM  Result Value Ref Range   Color, UA     Clarity, UA     Glucose, UA Negative Negative   Bilirubin, UA     Ketones, UA neg    Spec Grav, UA     Blood, UA trace    pH, UA     POC,PROTEIN,UA Negative Negative, Trace, Small (1+), Moderate (2+), Large (3+), 4+   Urobilinogen, UA     Nitrite, UA neg    Leukocytes, UA Small (1+) (A) Negative   Appearance     Odor      Assessment & Plan:  1) Low-risk pregnancy G2P1001 at [redacted]w[redacted]d with an Estimated Date of Delivery: 12/12/18   2) Late PN2 today,    Meds: No orders of the defined types were placed in this encounter.  Labs/procedures today: PN2  Plan:  Continue routine obstetrical care  Reviewed: Preterm labor symptoms and general obstetric precautions including but not limited to vaginal bleeding, contractions, leaking of fluid and fetal movement were reviewed in detail with the patient.  All questions were answered.  home bp cuff. Rx faxed to . Check bp weekly, let us know if >140/90.   Follow-up: Return in about 3 weeks (around 10/28/2018) for 1 week for RhoGam, then 3 weeks for webex, LROB.  Orders Placed This Encounter  Procedures  . POC Urinalysis Dipstick OB   Florian Buff 10/07/2018 9:43 AM

## 2018-10-08 ENCOUNTER — Other Ambulatory Visit: Payer: Self-pay | Admitting: Women's Health

## 2018-10-08 DIAGNOSIS — O24410 Gestational diabetes mellitus in pregnancy, diet controlled: Secondary | ICD-10-CM

## 2018-10-08 DIAGNOSIS — Z8632 Personal history of gestational diabetes: Secondary | ICD-10-CM | POA: Insufficient documentation

## 2018-10-08 LAB — CBC
Hematocrit: 38.2 % (ref 34.0–46.6)
Hemoglobin: 12.9 g/dL (ref 11.1–15.9)
MCH: 31.5 pg (ref 26.6–33.0)
MCHC: 33.8 g/dL (ref 31.5–35.7)
MCV: 93 fL (ref 79–97)
Platelets: 223 10*3/uL (ref 150–450)
RBC: 4.1 x10E6/uL (ref 3.77–5.28)
RDW: 12.4 % (ref 11.7–15.4)
WBC: 12.1 10*3/uL — ABNORMAL HIGH (ref 3.4–10.8)

## 2018-10-08 LAB — HIV ANTIBODY (ROUTINE TESTING W REFLEX): HIV Screen 4th Generation wRfx: NONREACTIVE

## 2018-10-08 LAB — GLUCOSE TOLERANCE, 2 HOURS W/ 1HR
Glucose, 1 hour: 193 mg/dL — ABNORMAL HIGH (ref 65–179)
Glucose, 2 hour: 120 mg/dL (ref 65–152)
Glucose, Fasting: 84 mg/dL (ref 65–91)

## 2018-10-08 LAB — ANTIBODY SCREEN: Antibody Screen: NEGATIVE

## 2018-10-08 LAB — RPR: RPR Ser Ql: NONREACTIVE

## 2018-10-09 ENCOUNTER — Other Ambulatory Visit: Payer: Self-pay | Admitting: *Deleted

## 2018-10-09 DIAGNOSIS — O24410 Gestational diabetes mellitus in pregnancy, diet controlled: Secondary | ICD-10-CM

## 2018-10-09 MED ORDER — BLOOD GLUCOSE METER KIT: PACK | 0 refills | Status: DC

## 2018-10-14 ENCOUNTER — Other Ambulatory Visit: Payer: Self-pay

## 2018-10-14 ENCOUNTER — Ambulatory Visit (INDEPENDENT_AMBULATORY_CARE_PROVIDER_SITE_OTHER): Payer: BC Managed Care – PPO | Admitting: *Deleted

## 2018-10-14 DIAGNOSIS — O360130 Maternal care for anti-D [Rh] antibodies, third trimester, not applicable or unspecified: Secondary | ICD-10-CM

## 2018-10-14 DIAGNOSIS — O0990 Supervision of high risk pregnancy, unspecified, unspecified trimester: Secondary | ICD-10-CM

## 2018-10-14 DIAGNOSIS — Z331 Pregnant state, incidental: Secondary | ICD-10-CM

## 2018-10-14 DIAGNOSIS — Z3A31 31 weeks gestation of pregnancy: Secondary | ICD-10-CM

## 2018-10-14 DIAGNOSIS — Z1389 Encounter for screening for other disorder: Secondary | ICD-10-CM

## 2018-10-14 DIAGNOSIS — Z6791 Unspecified blood type, Rh negative: Secondary | ICD-10-CM

## 2018-10-14 DIAGNOSIS — O26899 Other specified pregnancy related conditions, unspecified trimester: Secondary | ICD-10-CM

## 2018-10-14 LAB — POCT URINALYSIS DIPSTICK OB
Blood, UA: NEGATIVE
Glucose, UA: NEGATIVE
Ketones, UA: NEGATIVE
Leukocytes, UA: NEGATIVE
Nitrite, UA: NEGATIVE
POC,PROTEIN,UA: NEGATIVE

## 2018-10-14 NOTE — Progress Notes (Signed)
Rhogam 360mcg given in left glut with no complications. Pt to return as scheduled for next appt.

## 2018-10-21 ENCOUNTER — Telehealth: Payer: Self-pay | Admitting: Women's Health

## 2018-10-21 ENCOUNTER — Telehealth: Payer: Self-pay | Admitting: Obstetrics and Gynecology

## 2018-10-21 NOTE — Telephone Encounter (Signed)
Patient states she is having yellow vaginal discharge that is causing external itching.  She denies any cramping or abdominal discomfort.  Wants to be seen tomorrow if possible as she does not have a ride today.  Will put on schedule for tomorrow.

## 2018-10-21 NOTE — Telephone Encounter (Signed)
Called the patient to complete the pre-screen. The patient answered no to COVID19 symptoms and/or being previously diagnosed. Informed the patient of the wearing a face mask, sanitizing hands at the sanitizing station upon entering our office, and no visitors or children are allowed due to the COVID19 restrictions. The patient verbalized understanding. °

## 2018-10-21 NOTE — Telephone Encounter (Signed)
Patient called, stated she is 46mths.  She is having a yellowish discharge.  Please advise.  [phone]

## 2018-10-22 ENCOUNTER — Ambulatory Visit (INDEPENDENT_AMBULATORY_CARE_PROVIDER_SITE_OTHER): Payer: BC Managed Care – PPO | Admitting: Obstetrics and Gynecology

## 2018-10-22 ENCOUNTER — Encounter: Payer: Self-pay | Admitting: Obstetrics and Gynecology

## 2018-10-22 ENCOUNTER — Other Ambulatory Visit: Payer: Self-pay

## 2018-10-22 VITALS — BP 116/68 | HR 101 | Wt 158.0 lb

## 2018-10-22 DIAGNOSIS — Z331 Pregnant state, incidental: Secondary | ICD-10-CM

## 2018-10-22 DIAGNOSIS — Z3A32 32 weeks gestation of pregnancy: Secondary | ICD-10-CM

## 2018-10-22 DIAGNOSIS — Z1389 Encounter for screening for other disorder: Secondary | ICD-10-CM

## 2018-10-22 DIAGNOSIS — Z3483 Encounter for supervision of other normal pregnancy, third trimester: Secondary | ICD-10-CM

## 2018-10-22 LAB — POCT URINALYSIS DIPSTICK OB
Glucose, UA: NEGATIVE
Nitrite, UA: NEGATIVE

## 2018-10-22 MED ORDER — MICONAZOLE NITRATE 2 % VA CREA: 1.0000 | TOPICAL_CREAM | Freq: Every day | VAGINAL | 1 refills | Status: DC

## 2018-10-22 NOTE — Progress Notes (Signed)
Patient ID: David Towson, female   DOB: [birth year], 28 y.o.   MRN: [id number]    LOW-RISK PREGNANCY VISIT Patient name: Dashanique Brownstein MRN [id number]  Date of birth: Mar 23, 1991 Chief Complaint:   Vaginal Discharge (w/i discharge, itching, cramping)  History of Present Illness:   Xitlaly Ault is a 28 y.o. G47P1001 female at [redacted]w[redacted]d with an Estimated Date of Delivery: 12/12/18 being seen today for ongoing management of a low-risk pregnancy. A week and half itching and burning  with 1 episode of thick tachy discharge, hasn't tried to alleviate symptoms, hasn't gotten any worse Today she reports vaginal discharge. Contractions: Not present. Vag. Bleeding: None.  Movement: Present. denies leaking of fluid. Review of Systems:   Pertinent items are noted in HPI Denies abnormal headaches, visual changes, shortness of breath, chest pain, abdominal pain, severe nausea/vomiting, or problems with urination or bowel movements unless otherwise stated above. Pertinent History Reviewed:  Reviewed past medical,surgical, social, obstetrical and family history.  Reviewed problem list, medications and allergies. Physical Assessment:   Vitals:   10/22/18 1401  BP: 116/68  Pulse: (!) 101  Weight: 158 lb (71.7 kg)  Body mass index is 27.99 kg/m.        Physical Examination:   General appearance: Well appearing, and in no distress  Mental status: Alert, oriented to person, place, and time  Skin: Warm & dry  Cardiovascular: Normal heart rate noted  Respiratory: Normal respiratory effort, no distress  Abdomen: Soft, gravid, nontender  Pelvic: Cervical exam performed   Wet Prep: normal epithelial no clue      question of yeast.   Extremities: Edema: None  Fetal Status: Fetal Heart Rate (bpm): 133 Fundal Height: 32 cm Movement: Present    Results for orders placed or performed in visit on 10/22/18 (from the past 24 hour(s))  POC Urinalysis Dipstick OB   Collection Time: 10/22/18  2:06 PM  Result  Value Ref Range   Color, UA     Clarity, UA     Glucose, UA Negative Negative   Bilirubin, UA     Ketones, UA large    Spec Grav, UA     Blood, UA trace    pH, UA     POC,PROTEIN,UA Trace Negative, Trace, Small (1+), Moderate (2+), Large (3+), 4+   Urobilinogen, UA     Nitrite, UA neg    Leukocytes, UA Moderate (2+) (A) Negative   Appearance     Odor      Assessment & Plan:  1) Low-risk pregnancy G2P1001 at [redacted]w[redacted]d with an Estimated Date of Delivery: 12/12/18     Meds: No orders of the defined types were placed in this encounter.  Labs/procedures today: None  Plan:  Continue routine obstetrical care f/u in 2 weeks w/ televist  Reviewed: Preterm labor symptoms and general obstetric precautions including but not limited to vaginal bleeding, contractions, leaking of fluid and fetal movement were reviewed in detail with the patient.  All questions were answered.  Follow-up: No follow-ups on file.  Orders Placed This Encounter  Procedures   POC Urinalysis Dipstick OB   By signing my name below, I, Samul Dada, attest that this documentation has been prepared under the direction and in the presence of Jonnie Kind, MD. Electronically Signed: [place]. 10/22/18. 2:41 PM.  I personally performed the services described in this documentation, which was SCRIBED in my presence. The recorded information has been reviewed and considered accurate. It has been edited as necessary during  review. Jonnie Kind, MD

## 2018-10-25 ENCOUNTER — Other Ambulatory Visit: Payer: Self-pay | Admitting: Women's Health

## 2018-10-28 ENCOUNTER — Ambulatory Visit (INDEPENDENT_AMBULATORY_CARE_PROVIDER_SITE_OTHER): Payer: BC Managed Care – PPO | Admitting: Advanced Practice Midwife

## 2018-10-28 ENCOUNTER — Other Ambulatory Visit: Payer: Self-pay

## 2018-10-28 VITALS — BP 94/64 | HR 100

## 2018-10-28 DIAGNOSIS — O24410 Gestational diabetes mellitus in pregnancy, diet controlled: Secondary | ICD-10-CM

## 2018-10-28 DIAGNOSIS — O0993 Supervision of high risk pregnancy, unspecified, third trimester: Secondary | ICD-10-CM

## 2018-10-28 DIAGNOSIS — O0990 Supervision of high risk pregnancy, unspecified, unspecified trimester: Secondary | ICD-10-CM

## 2018-10-28 DIAGNOSIS — Z3A33 33 weeks gestation of pregnancy: Secondary | ICD-10-CM

## 2018-10-28 MED ORDER — TERCONAZOLE 0.4 % VA CREA: 1.0000 | TOPICAL_CREAM | Freq: Every day | VAGINAL | 0 refills | Status: DC

## 2018-10-28 NOTE — Progress Notes (Signed)
HIGH-RISK PREGNANCY VISIT     TELEHEALTH VIRTUAL OBSTETRICS VISIT ENCOUNTER NOTE  I connected with Kristi Anderson on 10/28/18 at 10:15 AM EDT by telephone at home and verified that I am speaking with the correct person using two identifiers.   I discussed the limitations, risks, security and privacy concerns of performing an evaluation and management service by telephone and the availability of in person appointments. I also discussed with the patient that there may be a patient responsible charge related to this service. The patient expressed understanding and agreed to proceed.  Subjective:  Kristi Anderson is a 28 y.o. G2P1001 at [redacted]w[redacted]d being followed for ongoing prenatal care.  She is currently monitored for the following issues for this high-risk pregnancy and has Post traumatic stress disorder; Vitamin D deficiency; Hx of preeclampsia, prior pregnancy, currently pregnant; Supervision of high risk pregnancy, antepartum; Rh negative state in antepartum period; and Gestational diabetes mellitus, class A1 on their problem list.  Patient reports no complaints. Reports fetal movement. Denies any contractions, bleeding or leaking of fluid.  Has been checking BS QID for 3 weeks, all normal except:  FBS 101, 96, 107, 95 and 96:  2/21(<10%)^^;  2 hr PP:  161, 121, 122, 167, 144, 162, 129, 123, 128, 127, 205 (bag of chips), 145, 146, 150, 172, 146, 128, 126, 126, .  .13/63 significantly elevated (20%). Has appt w/nutrition on Thursday.    Says OTC yeast meds irritated, rx terazol.   The following portions of the patient's history were reviewed and updated as appropriate: allergies, current medications, past family history, past medical history, past social history, past surgical history and problem list.   Objective:   General:  Alert, oriented and cooperative.   Mental Status: Normal mood and affect perceived. Normal judgment and thought content.  Rest of physical exam deferred due to type of  encounter  Assessment and Plan:  Pregnancy: G2P1001 at [redacted]w[redacted]d A1DM, borderline PP.  No meds at this time   Preterm labor symptoms and general obstetric precautions including but not limited to vaginal bleeding, contractions, leaking of fluid and fetal movement were reviewed in detail with the patient.  I discussed the assessment and treatment plan with the patient. The patient was provided an opportunity to ask questions and all were answered. The patient agreed with the plan and demonstrated an understanding of the instructions. The patient was advised to call back or seek an in-person office evaluation/go to [facility] at [facility] for any urgent or concerning symptoms. Please refer to After Visit Summary for other counseling recommendations.   I provided 15 minutes of non-face-to-face time during this encounter.  Return in about 2 weeks (around 11/11/2018) for LROB, GBS.  Future Appointments  Date Time Provider [place]  10/31/2018 11:15 AM Reola Calkins [place]  11/11/2018 11:30 AM Roma Schanz, CNM [facility]    Christin Fudge, [place] for [employer], [place]

## 2018-10-28 NOTE — Patient Instructions (Signed)
Kristi Anderson, I greatly value your feedback.  If you receive a survey following your visit with [place] today, we appreciate you taking the time to fill it out.  Thanks, Nigel Berthold, CNM   Call the office [phone]) or go to [facility] if:  You begin to have strong, frequent contractions  Your water breaks.  Sometimes it is a big gush of fluid, sometimes it is just a trickle that keeps getting your panties wet or running down your legs  You have vaginal bleeding.  It is normal to have a small amount of spotting if your cervix was checked.   You don't feel your baby moving like normal.  If you don't, get you something to eat and drink and lay down and focus on feeling your baby move.  You should feel at least 10 movements in 2 hours.  If you don't, you should call the office or go to [facility].    Tdap Vaccine  It is recommended that you get the Tdap vaccine during the third trimester of EACH pregnancy to help protect your baby from getting pertussis (whooping cough)  27-36 weeks is the BEST time to do this so that you can pass the protection on to your baby. During pregnancy is better than after pregnancy, but if you are unable to get it during pregnancy it will be offered at the hospital.   You will be offered this vaccine in the office after 27 weeks. If you do not have health insurance, you can get this vaccine at the health department or your family doctor  Everyone who will be around your baby should also be up-to-date on their vaccines. Adults (who are not pregnant) only need 1 dose of Tdap during adulthood.   Third Trimester of Pregnancy The third trimester is from week 29 through week 42, months 7 through 9. The third trimester is a time when the fetus is growing rapidly. At the end of the ninth month, the fetus is about 20 inches in length and weighs 6-10 pounds.  BODY CHANGES Your body goes through many changes during pregnancy. The changes vary from woman  to woman.   Your weight will continue to increase. You can expect to gain 25-35 pounds (11-16 kg) by the end of the pregnancy.  You may begin to get stretch marks on your hips, abdomen, and breasts.  You may urinate more often because the fetus is moving lower into your pelvis and pressing on your bladder.  You may develop or continue to have heartburn as a result of your pregnancy.  You may develop constipation because certain hormones are causing the muscles that push waste through your intestines to slow down.  You may develop hemorrhoids or swollen, bulging veins (varicose veins).  You may have pelvic pain because of the weight gain and pregnancy hormones relaxing your joints between the bones in your pelvis. Backaches may result from overexertion of the muscles supporting your posture.  You may have changes in your hair. These can include thickening of your hair, rapid growth, and changes in texture. Some women also have hair loss during or after pregnancy, or hair that feels dry or thin. Your hair will most likely return to normal after your baby is born.  Your breasts will continue to grow and be tender. A yellow discharge may leak from your breasts called colostrum.  Your belly button may stick out.  You may feel short of breath because of your expanding uterus.  You  may notice the fetus "dropping," or moving lower in your abdomen.  You may have a bloody mucus discharge. This usually occurs a few days to a week before labor begins.  Your cervix becomes thin and soft (effaced) near your due date. WHAT TO EXPECT AT YOUR PRENATAL EXAMS  You will have prenatal exams every 2 weeks until week 36. Then, you will have weekly prenatal exams. During a routine prenatal visit:  You will be weighed to make sure you and the fetus are growing normally.  Your blood pressure is taken.  Your abdomen will be measured to track your baby's growth.  The fetal heartbeat will be listened  to.  Any test results from the previous visit will be discussed.  You may have a cervical check near your due date to see if you have effaced. At around 36 weeks, your caregiver will check your cervix. At the same time, your caregiver will also perform a test on the secretions of the vaginal tissue. This test is to determine if a type of bacteria, Group B streptococcus, is present. Your caregiver will explain this further. Your caregiver may ask you:  What your birth plan is.  How you are feeling.  If you are feeling the baby move.  If you have had any abnormal symptoms, such as leaking fluid, bleeding, severe headaches, or abdominal cramping.  If you have any questions. Other tests or screenings that may be performed during your third trimester include:  Blood tests that check for low iron levels (anemia).  Fetal testing to check the health, activity level, and growth of the fetus. Testing is done if you have certain medical conditions or if there are problems during the pregnancy. FALSE LABOR You may feel small, irregular contractions that eventually go away. These are called Braxton Hicks contractions, or false labor. Contractions may last for hours, days, or even weeks before true labor sets in. If contractions come at regular intervals, intensify, or become painful, it is best to be seen by your caregiver.  SIGNS OF LABOR   Menstrual-like cramps.  Contractions that are 5 minutes apart or less.  Contractions that start on the top of the uterus and spread down to the lower abdomen and back.  A sense of increased pelvic pressure or back pain.  A watery or bloody mucus discharge that comes from the vagina. If you have any of these signs before the 37th week of pregnancy, call your caregiver right away. You need to go to the hospital to get checked immediately. HOME CARE INSTRUCTIONS   Avoid all smoking, herbs, alcohol, and unprescribed drugs. These chemicals affect the  formation and growth of the baby.  Follow your caregiver's instructions regarding medicine use. There are medicines that are either safe or unsafe to take during pregnancy.  Exercise only as directed by your caregiver. Experiencing uterine cramps is a good sign to stop exercising.  Continue to eat regular, healthy meals.  Wear a good support bra for breast tenderness.  Do not use hot tubs, steam rooms, or saunas.  Wear your seat belt at all times when driving.  Avoid raw meat, uncooked cheese, cat litter boxes, and soil used by cats. These carry germs that can cause birth defects in the baby.  Take your prenatal vitamins.  Try taking a stool softener (if your caregiver approves) if you develop constipation. Eat more high-fiber foods, such as fresh vegetables or fruit and whole grains. Drink plenty of fluids to keep your urine clear  or pale yellow.  Take warm sitz baths to soothe any pain or discomfort caused by hemorrhoids. Use hemorrhoid cream if your caregiver approves.  If you develop varicose veins, wear support hose. Elevate your feet for 15 minutes, 3-4 times a day. Limit salt in your diet.  Avoid heavy lifting, wear low heal shoes, and practice good posture.  Rest a lot with your legs elevated if you have leg cramps or low back pain.  Visit your dentist if you have not gone during your pregnancy. Use a soft toothbrush to brush your teeth and be gentle when you floss.  A sexual relationship may be continued unless your caregiver directs you otherwise.  Do not travel far distances unless it is absolutely necessary and only with the approval of your caregiver.  Take prenatal classes to understand, practice, and ask questions about the labor and delivery.  Make a trial run to the hospital.  Pack your hospital bag.  Prepare the baby's nursery.  Continue to go to all your prenatal visits as directed by your caregiver. SEEK MEDICAL CARE IF:  You are unsure if you are in  labor or if your water has broken.  You have dizziness.  You have mild pelvic cramps, pelvic pressure, or nagging pain in your abdominal area.  You have persistent nausea, vomiting, or diarrhea.  You have a bad smelling vaginal discharge.  You have pain with urination. SEEK IMMEDIATE MEDICAL CARE IF:   You have a fever.  You are leaking fluid from your vagina.  You have spotting or bleeding from your vagina.  You have severe abdominal cramping or pain.  You have rapid weight loss or gain.  You have shortness of breath with chest pain.  You notice sudden or extreme swelling of your face, hands, ankles, feet, or legs.  You have not felt your baby move in over an hour.  You have severe headaches that do not go away with medicine.  You have vision changes. Document Released: 03/14/2001 Document Revised: 03/25/2013 Document Reviewed: 05/21/2012 [facility] Patient Information 2015 [place], [place]. This information is not intended to replace advice given to you by your health care provider. Make sure you discuss any questions you have with your health care provider.

## 2018-10-31 ENCOUNTER — Encounter: Payer: BC Managed Care – PPO | Attending: Obstetrics & Gynecology | Admitting: *Deleted

## 2018-10-31 ENCOUNTER — Other Ambulatory Visit: Payer: Self-pay | Admitting: Lactation Services

## 2018-10-31 ENCOUNTER — Ambulatory Visit: Payer: BC Managed Care – PPO | Admitting: *Deleted

## 2018-10-31 ENCOUNTER — Other Ambulatory Visit (HOSPITAL_COMMUNITY): Payer: Self-pay | Admitting: Lactation Services

## 2018-10-31 ENCOUNTER — Other Ambulatory Visit: Payer: Self-pay

## 2018-10-31 DIAGNOSIS — Z3A00 Weeks of gestation of pregnancy not specified: Secondary | ICD-10-CM | POA: Insufficient documentation

## 2018-10-31 DIAGNOSIS — O24419 Gestational diabetes mellitus in pregnancy, unspecified control: Secondary | ICD-10-CM | POA: Diagnosis not present

## 2018-10-31 DIAGNOSIS — Z713 Dietary counseling and surveillance: Secondary | ICD-10-CM | POA: Diagnosis not present

## 2018-10-31 MED ORDER — ACCU-CHEK FASTCLIX LANCETS MISC: 1.0000 | Freq: Four times a day (QID) | 12 refills | Status: DC

## 2018-10-31 MED ORDER — ACCU-CHEK GUIDE W/DEVICE KIT: 1.0000 | PACK | Freq: Once | 0 refills | Status: AC

## 2018-10-31 MED ORDER — ACCU-CHEK GUIDE VI STRP: ORAL_STRIP | 12 refills | Status: DC

## 2018-10-31 NOTE — Lactation Note (Unsigned)
Lactation Consultation Note  Patient Name: Kristi Anderson Today's Date: 10/31/2018     Maternal Data    Feeding    LATCH Score                   Interventions    Lactation Tools Discussed/Used     Consult Status      Kristi Anderson 10/31/2018, 1:08 PM

## 2018-10-31 NOTE — Progress Notes (Signed)
Ordered Psychiatric nurse and supplies per [employer], Diabetes Educator.

## 2018-10-31 NOTE — Progress Notes (Signed)
  Patient was seen on 10/31/2018 for Gestational Diabetes self-management. EDD 12/12/2018. Patient states she has had her meter for about a month and brought a BG Log Sheet with her. Patient states no history of GDM. Diet history obtained. Patient eats good variety of all food groups. Beverages include water .  The following learning objectives were met by the patient :   States the definition of Gestational Diabetes  States why dietary management is important in controlling blood glucose  Describes the effects of carbohydrates on blood glucose levels  Demonstrates ability to create a balanced meal plan  Demonstrates carbohydrate counting   States when to check blood glucose levels  Demonstrates proper blood glucose monitoring techniques  States the effect of stress and exercise on blood glucose levels  States the importance of limiting caffeine and abstaining from alcohol and smoking  Plan:  Aim for 3 Carb Choices per meal (45 grams) +/- 1 either way  Aim for 1-2 Carbs per snack Begin reading food labels for Total Carbohydrate of foods If OK with your MD, consider  increasing your activity level by walking, Arm Chair Exercises or other activity daily as tolerated Begin checking BG before breakfast and 2 hours after first bite of breakfast, lunch and dinner as directed by MD  Bring Log Book/Sheet and meter to every medical appointment OR use Baby Scripts (see below) Baby Scripts:  Patient was introduced to [employer] and plans to use as record of BG electronically  Take medication if directed by MD  Patient already has a meter: One Touch Verio, but her insurance did not pay for it and she needs more supplies. I plan to send in Rx for Accu Chek Guide, which Medicaid does pay for. I explained this to the patient and she agreed. And is testing pre breakfast and 2 hours each meal as directed by MD Review of Log Book shows: FBG running mostly in 80's with excursions to 105 Post meal  range is 77-137 with most being under 120 mg/dl  Patient instructed to monitor glucose levels: FBS: 60 - 95 mg/dl 2 hour: <120 mg/dl  Patient received the following handouts:  Nutrition Diabetes and Pregnancy  Carbohydrate Counting List  Patient will be seen for follow-up as needed.

## 2018-11-01 ENCOUNTER — Other Ambulatory Visit: Payer: Self-pay | Admitting: Obstetrics & Gynecology

## 2018-11-05 ENCOUNTER — Encounter: Payer: BC Managed Care – PPO | Admitting: Obstetrics & Gynecology

## 2018-11-11 ENCOUNTER — Encounter: Payer: Self-pay | Admitting: Women's Health

## 2018-11-11 ENCOUNTER — Other Ambulatory Visit: Payer: Self-pay

## 2018-11-11 ENCOUNTER — Ambulatory Visit (INDEPENDENT_AMBULATORY_CARE_PROVIDER_SITE_OTHER): Payer: BC Managed Care – PPO | Admitting: Women's Health

## 2018-11-11 VITALS — BP 106/70 | HR 91 | Wt 161.0 lb

## 2018-11-11 DIAGNOSIS — O0990 Supervision of high risk pregnancy, unspecified, unspecified trimester: Secondary | ICD-10-CM

## 2018-11-11 DIAGNOSIS — O0993 Supervision of high risk pregnancy, unspecified, third trimester: Secondary | ICD-10-CM

## 2018-11-11 DIAGNOSIS — Z3A35 35 weeks gestation of pregnancy: Secondary | ICD-10-CM

## 2018-11-11 DIAGNOSIS — Z1389 Encounter for screening for other disorder: Secondary | ICD-10-CM

## 2018-11-11 DIAGNOSIS — L292 Pruritus vulvae: Secondary | ICD-10-CM

## 2018-11-11 DIAGNOSIS — Z3483 Encounter for supervision of other normal pregnancy, third trimester: Secondary | ICD-10-CM

## 2018-11-11 DIAGNOSIS — O24419 Gestational diabetes mellitus in pregnancy, unspecified control: Secondary | ICD-10-CM

## 2018-11-11 LAB — POCT URINALYSIS DIPSTICK OB
Blood, UA: NEGATIVE
Glucose, UA: NEGATIVE
Ketones, UA: NEGATIVE
Leukocytes, UA: NEGATIVE
Nitrite, UA: NEGATIVE
POC,PROTEIN,UA: NEGATIVE

## 2018-11-11 LAB — OB RESULTS CONSOLE GBS: GBS: NEGATIVE

## 2018-11-11 LAB — POCT WET PREP (WET MOUNT)
Clue Cells Wet Prep Whiff POC: NEGATIVE
Trichomonas Wet Prep HPF POC: ABSENT

## 2018-11-11 LAB — OB RESULTS CONSOLE GC/CHLAMYDIA: Gonorrhea: NEGATIVE

## 2018-11-11 MED ORDER — METFORMIN HCL 500 MG PO TABS: 500.0000 mg | ORAL_TABLET | Freq: Every day | ORAL | 1 refills | Status: DC

## 2018-11-11 MED ORDER — NYSTATIN-TRIAMCINOLONE 100000-0.1 UNIT/GM-% EX OINT: 1.0000 "application " | TOPICAL_OINTMENT | Freq: Two times a day (BID) | CUTANEOUS | 0 refills | Status: DC

## 2018-11-11 NOTE — Progress Notes (Addendum)
HIGH-RISK PREGNANCY VISIT Patient name: Kristi Anderson MRN [id number]  Date of birth: [birth year] Chief Complaint:   Routine Prenatal Visit  History of Present Illness:   Kristi Anderson is a 28 y.o. G31P1001 female at [redacted]w[redacted]d with an Estimated Date of Delivery: 12/12/18 being seen today for ongoing management of a high-risk pregnancy complicated by A8TM rx'd metformin today Today she reports FBS all normal, 2hr pp 76-235 (majority 120s-130s). Vulvar itching, meds haven't helped.  Contractions: Not present. Vag. Bleeding: None.  Movement: Present. denies leaking of fluid.  Review of Systems:   Pertinent items are noted in HPI Denies abnormal vaginal discharge w/ itching/odor/irritation, headaches, visual changes, shortness of breath, chest pain, abdominal pain, severe nausea/vomiting, or problems with urination or bowel movements unless otherwise stated above. Pertinent History Reviewed:  Reviewed past medical,surgical, social, obstetrical and family history.  Reviewed problem list, medications and allergies. Physical Assessment:   Vitals:   11/11/18 1159  BP: 106/70  Pulse: 91  Weight: 161 lb (73 kg)  Body mass index is 28.52 kg/m.           Physical Examination:   General appearance: alert, well appearing, and in no distress  Mental status: alert, oriented to person, place, and time  Skin: warm & dry   Extremities: Edema: None    Cardiovascular: normal heart rate noted  Respiratory: normal respiratory effort, no distress  Abdomen: gravid, soft, non-tender  Pelvic: spec exam by [employer], SNP normal vagina/cx, scant normal d/c       GBS, gc/ct collected  Fetal Status: Fetal Heart Rate (bpm): 139 Fundal Height: 33 cm Movement: Present    Fetal Surveillance Testing today: doppler   Results for orders placed or performed in visit on 11/11/18 (from the past 24 hour(s))  POC Urinalysis Dipstick OB   Collection Time: 11/11/18 12:04 PM  Result Value Ref Range   Color, UA     Clarity, UA     Glucose, UA Negative Negative   Bilirubin, UA     Ketones, UA neg    Spec Grav, UA     Blood, UA neg    pH, UA     POC,PROTEIN,UA Negative Negative, Trace, Small (1+), Moderate (2+), Large (3+), 4+   Urobilinogen, UA     Nitrite, UA neg    Leukocytes, UA Negative Negative   Appearance     Odor    POCT Wet Prep [place])   Collection Time: 11/11/18  1:01 PM  Result Value Ref Range   Source Wet Prep POC vaginal    WBC, Wet Prep HPF POC few    Bacteria Wet Prep HPF POC None (A) Few   BACTERIA WET PREP MORPHOLOGY POC     Clue Cells Wet Prep HPF POC None None   Clue Cells Wet Prep Whiff POC Negative Whiff    Yeast Wet Prep HPF POC None None   KOH Wet Prep POC     Trichomonas Wet Prep HPF POC Absent Absent    Assessment & Plan:  1) High-risk pregnancy G2P1001 at [redacted]w[redacted]d with an Estimated Date of Delivery: 12/12/18   2) A2DM, unstable, rx'd metformin 500mg  AM today, needs EFW u/s, begin 2x/wk testing  3) H/O pre-e, ASA  4) Vulvar itching> normal wet prep, rx mycolog  Meds:  Meds ordered this encounter  Medications  . nystatin-triamcinolone ointment (MYCOLOG)    Sig: Apply 1 application topically 2 (two) times daily.    Dispense:  30 g    Refill:  0    Order Specific Question:   Supervising Provider    Answer:   Despina HiddenEURE, LUTHER H [[id number]]  . metFORMIN (GLUCOPHAGE) 500 MG tablet    Sig: Take 1 tablet (500 mg total) by mouth daily with breakfast.    Dispense:  30 tablet    Refill:  1    Order Specific Question:   Supervising Provider    Answer:   Lazaro ArmsEURE, LUTHER H [[id number]]    Labs/procedures today: gbs, gc/ct, wet prep  Treatment Plan:  Growth u/s @ 36wks     2x/wk testing or weekly BPP    Deliver @ 39wks  Reviewed: Preterm labor symptoms and general obstetric precautions including but not limited to vaginal bleeding, contractions, leaking of fluid and fetal movement were reviewed in detail with the patient.  All questions were answered. Has home bp cuff. Check bp  weekly, let us know if >140/90.   Follow-up: Return for Thurs for efw/afi/bpp @ [facility], then next Mon here for NST/HROB, then [place] Ca[place]ur here for NST w/ nurse.  Orders Placed This Encounter  Procedures  . GC/Chlamydia Probe Amp  . Culture, beta strep (group b only)  . POC Urinalysis Dipstick OB  . POCT Wet Prep [facility]([place] [place])   Cheral MarkerKimberly R Booker CNM, [facility]NP-BC 11/11/2018 2:26 PM

## 2018-11-11 NOTE — Patient Instructions (Signed)
Kristi Anderson, I greatly value your feedback.  If you receive a survey following your visit with us today, we appreciate you taking the time to fill it out.  Thanks, Joellyn HaffKim Booker, CNM, [facility]  [facility] now [facility] ([address] [place], [place] [address]) Entrance located off of [address] Free 24/7 valet parking   Go to [email or web address] to register for FREE online childbirth classes    Call the office [phone]) or go to [facility] if:  You begin to have strong, frequent contractions  Your water breaks.  Sometimes it is a big gush of fluid, sometimes it is just a trickle that keeps getting your panties wet or running down your legs  You have vaginal bleeding.  It is normal to have a small amount of spotting if your cervix was checked.   You don't feel your baby moving like normal.  If you don't, get you something to eat and drink and lay down and focus on feeling your baby move.  You should feel at least 10 movements in 2 hours.  If you don't, you should call the office or go to [facility].   Call the office [phone]) or go to [facility] for these signs of pre-eclampsia:  Severe headache that does not go away with Tylenol  Visual changes- seeing spots, double, blurred vision  Pain under your right breast or upper abdomen that does not go away with Tums or heartburn medicine  Nausea and/or vomiting  Severe swelling in your hands, feet, and face      Home Blood Pressure Monitoring for Patients   Your provider has recommended that you check your blood pressure (BP) at least once a week at home. If you do not have a blood pressure cuff at home, one will be provided for you. Contact your provider if you have not received your monitor within 1 week.   Helpful Tips for Accurate Home Blood Pressure Checks  . Don't smoke, exercise, or drink caffeine 30 minutes before checking your BP . Use the  restroom before checking your BP (a full bladder can raise your pressure) . Relax in a comfortable upright chair . Feet on the ground . Left arm resting comfortably on a flat surface at the level of your heart . Legs uncrossed . Back supported . Sit quietly and don't talk . Place the cuff on your bare arm . Adjust snuggly, so that only two fingertips can fit between your skin and the top of the cuff . Check 2 readings separated by at least one minute . Keep a log of your BP readings . For a visual, please reference this diagram: http://ccnc.care/bpdiagram  Provider Name: [facility]     Phone: [phone]  Zone 1: ALL CLEAR  Continue to monitor your symptoms:  . BP reading is less than 140 (top number) or less than 90 (bottom number)  . No right upper stomach pain . No headaches or seeing spots . No feeling nauseated or throwing up . No swelling in face and hands  Zone 2: CAUTION Call your doctor's office for any of the following:  . BP reading is greater than 140 (top number) or greater than 90 (bottom number)  . Stomach pain under your ribs in the middle or right side . Headaches or seeing spots . Feeling nauseated or throwing up . Swelling in face and hands  Zone 3: EMERGENCY  Seek immediate  medical care if you have any of the following:  . BP reading is greater than160 (top number) or greater than 110 (bottom number) . Severe headaches not improving with Tylenol . Serious difficulty catching your breath . Any worsening symptoms from Zone 2    Preterm Labor and Birth Information  The normal length of a pregnancy is 39-41 weeks. Preterm labor is when labor starts before 37 completed weeks of pregnancy. What are the risk factors for preterm labor? Preterm labor is more likely to occur in women who:  Have certain infections during pregnancy such as a bladder infection, sexually transmitted infection, or infection inside the uterus (chorioamnionitis).  Have a  shorter-than-normal cervix.  Have gone into preterm labor before.  Have had surgery on their cervix.  Are younger than age 71 or older than age 76.  Are African American.  Are pregnant with twins or multiple babies (multiple gestation).  Take street drugs or smoke while pregnant.  Do not gain enough weight while pregnant.  Became pregnant shortly after having been pregnant. What are the symptoms of preterm labor? Symptoms of preterm labor include:  Cramps similar to those that can happen during a menstrual period. The cramps may happen with diarrhea.  Pain in the abdomen or lower back.  Regular uterine contractions that may feel like tightening of the abdomen.  A feeling of increased pressure in the pelvis.  Increased watery or bloody mucus discharge from the vagina.  Water breaking (ruptured amniotic sac). Why is it important to recognize signs of preterm labor? It is important to recognize signs of preterm labor because babies who are born prematurely may not be fully developed. This can put them at an increased risk for:  Long-term (chronic) heart and lung problems.  Difficulty immediately after birth with regulating body systems, including blood sugar, body temperature, heart rate, and breathing rate.  Bleeding in the brain.  Cerebral palsy.  Learning difficulties.  Death. These risks are highest for babies who are born before 62 weeks of pregnancy. How is preterm labor treated? Treatment depends on the length of your pregnancy, your condition, and the health of your baby. It may involve:  Having a stitch (suture) placed in your cervix to prevent your cervix from opening too early (cerclage).  Taking or being given medicines, such as: ? Hormone medicines. These may be given early in pregnancy to help support the pregnancy. ? Medicine to stop contractions. ? Medicines to help mature the baby's lungs. These may be prescribed if the risk of delivery is high. ?  Medicines to prevent your baby from developing cerebral palsy. If the labor happens before 34 weeks of pregnancy, you may need to stay in the hospital. What should I do if I think I am in preterm labor? If you think that you are going into preterm labor, call your health care provider right away. How can I prevent preterm labor in future pregnancies? To increase your chance of having a full-term pregnancy:  Do not use any tobacco products, such as cigarettes, chewing tobacco, and e-cigarettes. If you need help quitting, ask your health care provider.  Do not use street drugs or medicines that have not been prescribed to you during your pregnancy.  Talk with your health care provider before taking any herbal supplements, even if you have been taking them regularly.  Make sure you gain a healthy amount of weight during your pregnancy.  Watch for infection. If you think that you might have an infection,  get it checked right away.  Make sure to tell your health care provider if you have gone into preterm labor before. This information is not intended to replace advice given to you by your health care provider. Make sure you discuss any questions you have with your health care provider. Document Released: 06/10/2003 Document Revised: 07/12/2018 Document Reviewed: 08/11/2015 Elsevier Patient Education  2020 [employer]

## 2018-11-12 ENCOUNTER — Other Ambulatory Visit: Payer: Self-pay | Admitting: *Deleted

## 2018-11-12 DIAGNOSIS — O0990 Supervision of high risk pregnancy, unspecified, unspecified trimester: Secondary | ICD-10-CM

## 2018-11-12 DIAGNOSIS — Z3A35 35 weeks gestation of pregnancy: Secondary | ICD-10-CM

## 2018-11-12 DIAGNOSIS — O24419 Gestational diabetes mellitus in pregnancy, unspecified control: Secondary | ICD-10-CM

## 2018-11-14 ENCOUNTER — Ambulatory Visit (HOSPITAL_COMMUNITY)
Admission: RE | Admit: 2018-11-14 | Discharge: 2018-11-14 | Disposition: A | Discharge status: Home / Self Care | Payer: BC Managed Care – PPO | Source: Ambulatory Visit | Attending: Women's Health | Admitting: Women's Health

## 2018-11-14 ENCOUNTER — Other Ambulatory Visit: Payer: Self-pay | Admitting: *Deleted

## 2018-11-14 ENCOUNTER — Other Ambulatory Visit: Payer: Self-pay

## 2018-11-14 DIAGNOSIS — O09299 Supervision of pregnancy with other poor reproductive or obstetric history, unspecified trimester: Secondary | ICD-10-CM | POA: Diagnosis not present

## 2018-11-14 DIAGNOSIS — Z3A36 36 weeks gestation of pregnancy: Secondary | ICD-10-CM

## 2018-11-14 DIAGNOSIS — O360130 Maternal care for anti-D [Rh] antibodies, third trimester, not applicable or unspecified: Secondary | ICD-10-CM | POA: Diagnosis not present

## 2018-11-14 DIAGNOSIS — O24415 Gestational diabetes mellitus in pregnancy, controlled by oral hypoglycemic drugs: Secondary | ICD-10-CM

## 2018-11-14 DIAGNOSIS — O360190 Maternal care for anti-D [Rh] antibodies, unspecified trimester, not applicable or unspecified: Secondary | ICD-10-CM | POA: Diagnosis not present

## 2018-11-14 DIAGNOSIS — O09293 Supervision of pregnancy with other poor reproductive or obstetric history, third trimester: Secondary | ICD-10-CM

## 2018-11-14 DIAGNOSIS — Z363 Encounter for antenatal screening for malformations: Secondary | ICD-10-CM

## 2018-11-14 DIAGNOSIS — Z3A35 35 weeks gestation of pregnancy: Secondary | ICD-10-CM | POA: Diagnosis not present

## 2018-11-14 DIAGNOSIS — O24419 Gestational diabetes mellitus in pregnancy, unspecified control: Secondary | ICD-10-CM

## 2018-11-14 DIAGNOSIS — O0990 Supervision of high risk pregnancy, unspecified, unspecified trimester: Secondary | ICD-10-CM | POA: Diagnosis not present

## 2018-11-15 LAB — GC/CHLAMYDIA PROBE AMP
Chlamydia trachomatis, NAA: NEGATIVE
Neisseria Gonorrhoeae by PCR: NEGATIVE

## 2018-11-15 LAB — CULTURE, BETA STREP (GROUP B ONLY): Strep Gp B Culture: NEGATIVE

## 2018-11-18 ENCOUNTER — Other Ambulatory Visit: Payer: Self-pay

## 2018-11-18 ENCOUNTER — Encounter: Payer: Self-pay | Admitting: Obstetrics & Gynecology

## 2018-11-18 ENCOUNTER — Ambulatory Visit (INDEPENDENT_AMBULATORY_CARE_PROVIDER_SITE_OTHER): Payer: BC Managed Care – PPO | Admitting: Obstetrics & Gynecology

## 2018-11-18 VITALS — BP 106/71 | HR 88 | Wt 162.0 lb

## 2018-11-18 DIAGNOSIS — Z1389 Encounter for screening for other disorder: Secondary | ICD-10-CM

## 2018-11-18 DIAGNOSIS — Z23 Encounter for immunization: Secondary | ICD-10-CM | POA: Diagnosis not present

## 2018-11-18 DIAGNOSIS — O0990 Supervision of high risk pregnancy, unspecified, unspecified trimester: Secondary | ICD-10-CM

## 2018-11-18 DIAGNOSIS — Z3A36 36 weeks gestation of pregnancy: Secondary | ICD-10-CM

## 2018-11-18 DIAGNOSIS — O0993 Supervision of high risk pregnancy, unspecified, third trimester: Secondary | ICD-10-CM

## 2018-11-18 DIAGNOSIS — O24419 Gestational diabetes mellitus in pregnancy, unspecified control: Secondary | ICD-10-CM

## 2018-11-18 DIAGNOSIS — Z331 Pregnant state, incidental: Secondary | ICD-10-CM

## 2018-11-18 LAB — POCT URINALYSIS DIPSTICK OB
Blood, UA: NEGATIVE
Glucose, UA: NEGATIVE
Ketones, UA: NEGATIVE
Leukocytes, UA: NEGATIVE
Nitrite, UA: NEGATIVE

## 2018-11-18 NOTE — Progress Notes (Signed)
HIGH-RISK PREGNANCY VISIT Patient name: Kristi Anderson MRN [id number]  Date of birth: June 02, 1990 Chief Complaint:   High Risk Gestation (NST room # 12)  History of Present Illness:   Kristi Anderson is a 28 y.o. G82P1001 female at [redacted]w[redacted]d with an Estimated Date of Delivery: 12/12/18 being seen today for ongoing management of a high-risk pregnancy complicated by M5HQ currently on metformin 500 mg in am Today she reports no complaints. Contractions: Irregular.  .  Movement: Present. denies leaking of fluid.  Review of Systems:   Pertinent items are noted in HPI Denies abnormal vaginal discharge w/ itching/odor/irritation, headaches, visual changes, shortness of breath, chest pain, abdominal pain, severe nausea/vomiting, or problems with urination or bowel movements unless otherwise stated above. Pertinent History Reviewed:  Reviewed past medical,surgical, social, obstetrical and family history.  Reviewed problem list, medications and allergies. Physical Assessment:   Vitals:   11/18/18 1357  BP: 106/71  Pulse: 88  Weight: 162 lb (73.5 kg)  Body mass index is 28.7 kg/m.           Physical Examination:   General appearance: alert, well appearing, and in no distress  Mental status: alert, oriented to person, place, and time  Skin: warm & dry   Extremities: Edema: None    Cardiovascular: normal heart rate noted  Respiratory: normal respiratory effort, no distress  Abdomen: gravid, soft, non-tender  Pelvic: Cervical exam deferred         Fetal Status: Fetal Heart Rate (bpm): 140 Fundal Height: 34 cm Movement: Present Presentation: Vertex  Fetal Surveillance Testing today: Reactive NST  Kristi Anderson is at [redacted]w[redacted]d Estimated Date of Delivery: 12/12/18  NST being performed due to class a2 DM  Today the NST is Reactive  Fetal Monitoring:  Baseline: 140 bpm, Variability: Good {> 6 bpm), Accelerations: Reactive and Decelerations: Absent   reactive  The accelerations are >15 bpm and  more than 2 in 20 minutes  Final diagnosis:  Reactive NST  Kristi Buff, MD     Results for orders placed or performed in visit on 11/18/18 (from the past 24 hour(s))  POC Urinalysis Dipstick OB   Collection Time: 11/18/18  2:03 PM  Result Value Ref Range   Color, UA     Clarity, UA     Glucose, UA Negative Negative   Bilirubin, UA     Ketones, UA neg    Spec Grav, UA     Blood, UA neg    pH, UA     POC,PROTEIN,UA Trace Negative, Trace, Small (1+), Moderate (2+), Large (3+), 4+   Urobilinogen, UA     Nitrite, UA neg    Leukocytes, UA Negative Negative   Appearance     Odor      Assessment & Plan:  1) High-risk pregnancy G2P1001 at [redacted]w[redacted]d with an Estimated Date of Delivery: 12/12/18   2) Class A2 DM now on metformin 500 mg am with CBG doing well, stable    Meds: No orders of the defined types were placed in this encounter.   Labs/procedures today: reactive NST  Treatment Plan:  Twice weekly surevillance, IOL 39 weeks or as clinically indicated  Reviewed: Term labor symptoms and general obstetric precautions including but not limited to vaginal bleeding, contractions, leaking of fluid and fetal movement were reviewed in detail with the patient.  All questions were answered. Has home bp cuff. Rx faxed to . Check bp weekly, let us know if >140/90.   Follow-up: Return in about 3  days (around 11/21/2018) for NST, HROB.  Orders Placed This Encounter  Procedures  . Tdap vaccine greater than or equal to 7yo IM  . POC Urinalysis Dipstick OB   Kristi Anderson  11/18/2018 2:50 PM

## 2018-11-21 ENCOUNTER — Ambulatory Visit: Payer: BC Managed Care – PPO | Admitting: *Deleted

## 2018-11-21 ENCOUNTER — Other Ambulatory Visit: Payer: Self-pay

## 2018-11-21 VITALS — BP 112/74 | HR 87 | Wt 162.0 lb

## 2018-11-21 DIAGNOSIS — O0990 Supervision of high risk pregnancy, unspecified, unspecified trimester: Secondary | ICD-10-CM

## 2018-11-21 DIAGNOSIS — Z1389 Encounter for screening for other disorder: Secondary | ICD-10-CM

## 2018-11-21 DIAGNOSIS — Z331 Pregnant state, incidental: Secondary | ICD-10-CM

## 2018-11-21 LAB — POCT URINALYSIS DIPSTICK OB
Blood, UA: NEGATIVE
Glucose, UA: NEGATIVE
Ketones, UA: NEGATIVE
Leukocytes, UA: NEGATIVE
Nitrite, UA: NEGATIVE
POC,PROTEIN,UA: NEGATIVE

## 2018-11-21 NOTE — Progress Notes (Signed)
NST reviewed by Dr Elonda Husky.  To return next week for NST, visit.

## 2018-11-26 ENCOUNTER — Other Ambulatory Visit: Payer: Self-pay

## 2018-11-26 ENCOUNTER — Ambulatory Visit (INDEPENDENT_AMBULATORY_CARE_PROVIDER_SITE_OTHER): Payer: BC Managed Care – PPO | Admitting: Women's Health

## 2018-11-26 ENCOUNTER — Encounter: Payer: Self-pay | Admitting: Women's Health

## 2018-11-26 VITALS — BP 110/71 | HR 89 | Wt 162.5 lb

## 2018-11-26 DIAGNOSIS — O0993 Supervision of high risk pregnancy, unspecified, third trimester: Secondary | ICD-10-CM | POA: Diagnosis not present

## 2018-11-26 DIAGNOSIS — O24419 Gestational diabetes mellitus in pregnancy, unspecified control: Secondary | ICD-10-CM

## 2018-11-26 DIAGNOSIS — O0990 Supervision of high risk pregnancy, unspecified, unspecified trimester: Secondary | ICD-10-CM

## 2018-11-26 DIAGNOSIS — Z1389 Encounter for screening for other disorder: Secondary | ICD-10-CM

## 2018-11-26 DIAGNOSIS — Z331 Pregnant state, incidental: Secondary | ICD-10-CM

## 2018-11-26 DIAGNOSIS — Z3A37 37 weeks gestation of pregnancy: Secondary | ICD-10-CM | POA: Diagnosis not present

## 2018-11-26 LAB — POCT URINALYSIS DIPSTICK OB
Blood, UA: NEGATIVE
Glucose, UA: NEGATIVE
Ketones, UA: NEGATIVE
Leukocytes, UA: NEGATIVE
Nitrite, UA: NEGATIVE

## 2018-11-26 NOTE — Patient Instructions (Addendum)
Pricilla Riffle, I greatly value your feedback.  If you receive a survey following your visit with [place] today, we appreciate you taking the time to fill it out.  Thanks, Joellyn Haff, CNM, [facility]  [facility] HAS MOVED!!! It is now [facility] ([address] [place], [place] [address]) Entrance located off of [address] [facility] Free 24/7 valet parking   Increase the metformin to 2 in the morning (1,000mg  total) and 1 at night (500mg )  Your induction is scheduled for Wed 9/3 @ 7:00am. Go to the main desk at [facility] hospital and let them know you are there to be induced. They will send someone from Labor & Delivery to come get you.    Go to [email or web address] to register for FREE online childbirth classes    Call the office [phone]) or go to [facility] if:  You begin to have strong, frequent contractions  Your water breaks.  Sometimes it is a big gush of fluid, sometimes it is just a trickle that keeps getting your panties wet or running down your legs  You have vaginal bleeding.  It is normal to have a small amount of spotting if your cervix was checked.   You don't feel your baby moving like normal.  If you don't, get you something to eat and drink and lay down and focus on feeling your baby move.  You should feel at least 10 movements in 2 hours.  If you don't, you should call the office or go to [facility].   Home Blood Pressure Monitoring for Patients   Your provider has recommended that you check your blood pressure (BP) at least once a week at home. If you do not have a blood pressure cuff at home, one will be provided for you. Contact your provider if you have not received your monitor within 1 week.   Helpful Tips for Accurate Home Blood Pressure Checks   Don't smoke, exercise, or drink caffeine 30 minutes before checking your BP  Use the restroom before checking your BP (a full bladder can raise your pressure)  Relax in a comfortable  upright chair  Feet on the ground  Left arm resting comfortably on a flat surface at the level of your heart  Legs uncrossed  Back supported  Sit quietly and don't talk  Place the cuff on your bare arm  Adjust snuggly, so that only two fingertips can fit between your skin and the top of the cuff  Check 2 readings separated by at least one minute  Keep a log of your BP readings  For a visual, please reference this diagram: http://ccnc.care/bpdiagram  Provider Name: [facility]     Phone: [phone]  Zone 1: ALL CLEAR  Continue to monitor your symptoms:   BP reading is less than 140 (top number) or less than 90 (bottom number)   No right upper stomach pain  No headaches or seeing spots  No feeling nauseated or throwing up  No swelling in face and hands  Zone 2: CAUTION Call your doctor's office for any of the following:   BP reading is greater than 140 (top number) or greater than 90 (bottom number)   Stomach pain under your ribs in the middle or right side  Headaches or seeing spots  Feeling nauseated or throwing up  Swelling in face and hands  Zone 3: EMERGENCY  Seek immediate medical care if you have any of the following:  BP reading is greater than160 (top number) or greater than 110 (bottom number)  Severe headaches not improving with Tylenol  Serious difficulty catching your breath  Any worsening symptoms from Zone 2    Braxton Hicks Contractions Contractions of the uterus can occur throughout pregnancy, but they are not always a sign that you are in labor. You may have practice contractions called Braxton Hicks contractions. These false labor contractions are sometimes confused with true labor. What are Deberah PeltonBraxton Hicks contractions? Braxton Hicks contractions are tightening movements that occur in the muscles of the uterus before labor. Unlike true labor contractions, these contractions do not result in opening (dilation) and thinning of  the cervix. Toward the end of pregnancy (32-34 weeks), Braxton Hicks contractions can happen more often and may become stronger. These contractions are sometimes difficult to tell apart from true labor because they can be very uncomfortable. You should not feel embarrassed if you go to the hospital with false labor. Sometimes, the only way to tell if you are in true labor is for your health care provider to look for changes in the cervix. The health care provider will do a physical exam and may monitor your contractions. If you are not in true labor, the exam should show that your cervix is not dilating and your water has not broken. If there are no other health problems associated with your pregnancy, it is completely safe for you to be sent home with false labor. You may continue to have Braxton Hicks contractions until you go into true labor. How to tell the difference between true labor and false labor True labor  Contractions last 30-70 seconds.  Contractions become very regular.  Discomfort is usually felt in the top of the uterus, and it spreads to the lower abdomen and low back.  Contractions do not go away with walking.  Contractions usually become more intense and increase in frequency.  The cervix dilates and gets thinner. False labor  Contractions are usually shorter and not as strong as true labor contractions.  Contractions are usually irregular.  Contractions are often felt in the front of the lower abdomen and in the groin.  Contractions may go away when you walk around or change positions while lying down.  Contractions get weaker and are shorter-lasting as time goes on.  The cervix usually does not dilate or become thin. Follow these instructions at home:   Take over-the-counter and prescription medicines only as told by your health care provider.  Keep up with your usual exercises and follow other instructions from your health care provider.  Eat and drink  lightly if you think you are going into labor.  If Braxton Hicks contractions are making you uncomfortable: ? Change your position from lying down or resting to walking, or change from walking to resting. ? Sit and rest in a tub of warm water. ? Drink enough fluid to keep your urine pale yellow. Dehydration may cause these contractions. ? Do slow and deep breathing several times an hour.  Keep all follow-up prenatal visits as told by your health care provider. This is important. Contact a health care provider if:  You have a fever.  You have continuous pain in your abdomen. Get help right away if:  Your contractions become stronger, more regular, and closer together.  You have fluid leaking or gushing from your vagina.  You pass blood-tinged mucus (bloody show).  You have bleeding from your vagina.  You have low back pain that you  never had before.  You feel your babys head pushing down and causing pelvic pressure.  Your baby is not moving inside you as much as it used to. Summary  Contractions that occur before labor are called Braxton Hicks contractions, false labor, or practice contractions.  Braxton Hicks contractions are usually shorter, weaker, farther apart, and less regular than true labor contractions. True labor contractions usually become progressively stronger and regular, and they become more frequent.  Manage discomfort from [facility] contractions by changing position, resting in a warm bath, drinking plenty of water, or practicing deep breathing. This information is not intended to replace advice given to you by your health care provider. Make sure you discuss any questions you have with your health care provider. Document Released: 08/03/2016 Document Revised: 03/02/2017 Document Reviewed: 08/03/2016 Elsevier Patient Education  2020 [employer].

## 2018-11-26 NOTE — Progress Notes (Signed)
Induction Assessment Scheduling Form Fax to [facility]:  [phone]  Kristi Anderson                                                                                   DOB:  1990-12-27                                                            MRN:  [id number]                                                                     Phone #:             [phone] (Mobile)        Provider:  [facility]  GP:  [id number]                                                            Estimated Date of Delivery: 12/12/18  Dating Criteria: LMP c/w 11wk u/s    Medical Indications for induction:  A2DM Admission Date/Time:  9/3 @ 0700 Gestational age on admission:  39.0   Filed Weights   11/26/18 1509  Weight: 162 lb 8 oz (73.7 kg)   HIV:  Non Reactive (07/06 0855) GBS:  neg  SVE: not done   Method of induction(proposed):  cytotec   Scheduling Provider Signature:  Roma Schanz, CNM                                            Today's Date:  11/26/2018

## 2018-11-26 NOTE — Progress Notes (Signed)
   HIGH-RISK PREGNANCY VISIT Patient name: Kristi Anderson MRN [id number]  Date of birth: 24-Apr-1990 Chief Complaint:   High Risk Gestation (NST)  History of Present Illness:   Glen Blatchley is a 28 y.o. G47P1001 female at [redacted]w[redacted]d with an Estimated Date of Delivery: 12/12/18 being seen today for ongoing management of a high-risk pregnancy complicated by U7OZ currently on metformin 500mg  AM Today she reports FBS 84-131 (almost 1/2 >95), 2hr pp 65-149 (1/2 >120). Contractions: Irregular. Vag. Bleeding: None.  Movement: Present. denies leaking of fluid.  Review of Systems:   Pertinent items are noted in HPI Denies abnormal vaginal discharge w/ itching/odor/irritation, headaches, visual changes, shortness of breath, chest pain, abdominal pain, severe nausea/vomiting, or problems with urination or bowel movements unless otherwise stated above. Pertinent History Reviewed:  Reviewed past medical,surgical, social, obstetrical and family history.  Reviewed problem list, medications and allergies. Physical Assessment:   Vitals:   11/26/18 1509  BP: 110/71  Pulse: 89  Weight: 162 lb 8 oz (73.7 kg)  Body mass index is 28.79 kg/m.           Physical Examination:   General appearance: alert, well appearing, and in no distress  Mental status: alert, oriented to person, place, and time  Skin: warm & dry   Extremities: Edema: Trace    Cardiovascular: normal heart rate noted  Respiratory: normal respiratory effort, no distress  Abdomen: gravid, soft, non-tender  Pelvic: Cervical exam deferred         Fetal Status: Fetal Heart Rate (bpm): 120   Movement: Present    Fetal Surveillance Testing today: NST: FHR baseline 120 bpm, Variability: moderate, Accelerations:present, Decelerations:  Absent= Cat 1/Reactive Toco: none    Results for orders placed or performed in visit on 11/26/18 (from the past 24 hour(s))  POC Urinalysis Dipstick OB   Collection Time: 11/26/18  3:10 PM  Result Value Ref  Range   Color, UA     Clarity, UA     Glucose, UA Negative Negative   Bilirubin, UA     Ketones, UA neg    Spec Grav, UA     Blood, UA neg    pH, UA     POC,PROTEIN,UA Trace Negative, Trace, Small (1+), Moderate (2+), Large (3+), 4+   Urobilinogen, UA     Nitrite, UA neg    Leukocytes, UA Negative Negative   Appearance     Odor      Assessment & Plan:  1) High-risk pregnancy G2P1001 at [redacted]w[redacted]d with an Estimated Date of Delivery: 12/12/18   2) A2DM, unstable, increase AM metformin to 1,000mg , start 500mg  in pm, EFW 33% @ 36wks  3) H/O pre-e, stable  Meds: No orders of the defined types were placed in this encounter.   Labs/procedures today: nst  Treatment Plan:  2x/wk NST, IOL scheduled for 9/3 @ 0700  IOL form faxed via Epic and orders placed   Reviewed: Term labor symptoms and general obstetric precautions including but not limited to vaginal bleeding, contractions, leaking of fluid and fetal movement were reviewed in detail with the patient.  All questions were answered.   Follow-up: Return for As scheduled friday nst w/ nurse, then Tues for hrob/nst w/ provider.  Orders Placed This Encounter  Procedures  . POC Urinalysis Dipstick OB   Roma Schanz CNM, [facility] 11/26/2018 5:53 PM

## 2018-11-27 ENCOUNTER — Telehealth (HOSPITAL_COMMUNITY): Payer: Self-pay | Admitting: *Deleted

## 2018-11-27 ENCOUNTER — Encounter (HOSPITAL_COMMUNITY): Payer: Self-pay | Admitting: *Deleted

## 2018-11-27 NOTE — Telephone Encounter (Signed)
Preadmission screen  

## 2018-11-29 ENCOUNTER — Other Ambulatory Visit: Payer: Self-pay

## 2018-11-29 ENCOUNTER — Ambulatory Visit (INDEPENDENT_AMBULATORY_CARE_PROVIDER_SITE_OTHER): Payer: BC Managed Care – PPO | Admitting: *Deleted

## 2018-11-29 ENCOUNTER — Other Ambulatory Visit: Payer: Self-pay | Admitting: Advanced Practice Midwife

## 2018-11-29 VITALS — BP 118/79 | HR 82 | Wt 162.0 lb

## 2018-11-29 DIAGNOSIS — Z331 Pregnant state, incidental: Secondary | ICD-10-CM

## 2018-11-29 DIAGNOSIS — Z1389 Encounter for screening for other disorder: Secondary | ICD-10-CM

## 2018-11-29 DIAGNOSIS — Z3A37 37 weeks gestation of pregnancy: Secondary | ICD-10-CM

## 2018-11-29 DIAGNOSIS — O0990 Supervision of high risk pregnancy, unspecified, unspecified trimester: Secondary | ICD-10-CM

## 2018-11-29 DIAGNOSIS — O24113 Pre-existing diabetes mellitus, type 2, in pregnancy, third trimester: Secondary | ICD-10-CM

## 2018-11-29 DIAGNOSIS — O0993 Supervision of high risk pregnancy, unspecified, third trimester: Secondary | ICD-10-CM

## 2018-11-29 LAB — POCT URINALYSIS DIPSTICK OB
Blood, UA: NEGATIVE
Glucose, UA: NEGATIVE
Ketones, UA: NEGATIVE
Leukocytes, UA: NEGATIVE
Nitrite, UA: NEGATIVE

## 2018-12-02 ENCOUNTER — Other Ambulatory Visit: Payer: Self-pay | Admitting: Women's Health

## 2018-12-02 ENCOUNTER — Other Ambulatory Visit: Payer: Self-pay | Admitting: Family Medicine

## 2018-12-02 ENCOUNTER — Telehealth: Payer: Self-pay | Admitting: Obstetrics & Gynecology

## 2018-12-02 ENCOUNTER — Telehealth: Payer: Self-pay | Admitting: *Deleted

## 2018-12-02 MED ORDER — METFORMIN HCL 1000 MG PO TABS: ORAL_TABLET | ORAL | 0 refills | Status: DC

## 2018-12-02 NOTE — Telephone Encounter (Signed)
Patient informed I spoke with Dr Elonda Husky and we will take her off the schedule tomorrow and have her come in on Thursday for a visit and NST. Induction moved to 9/6 @ 8am. Will have COVID test on Friday.

## 2018-12-02 NOTE — Telephone Encounter (Signed)
Let's move the induction to Saturday and get the COVID test on Thursday, that will be fine, we want her to be able to attend the funeral etc.  Let me know if this is ok with the patient

## 2018-12-02 NOTE — Telephone Encounter (Signed)
Patient called, stated that mother-in-law passed away last night.  She's scheduled to be induced Thursday and covid test tomorrow.  The funeral is Wednesday.  She doesn't know what to do.  She really doesn't want to miss her funeral, she's been like a mom to her.  She would like to speak to a nurse.  [phone]

## 2018-12-03 ENCOUNTER — Other Ambulatory Visit: Payer: BC Managed Care – PPO | Admitting: Obstetrics & Gynecology

## 2018-12-03 ENCOUNTER — Other Ambulatory Visit (HOSPITAL_COMMUNITY): Payer: BC Managed Care – PPO

## 2018-12-05 ENCOUNTER — Inpatient Hospital Stay (HOSPITAL_COMMUNITY): Payer: BC Managed Care – PPO

## 2018-12-05 ENCOUNTER — Other Ambulatory Visit: Payer: Self-pay

## 2018-12-05 ENCOUNTER — Encounter: Payer: Self-pay | Admitting: Advanced Practice Midwife

## 2018-12-05 ENCOUNTER — Inpatient Hospital Stay (HOSPITAL_COMMUNITY)
Admission: AD | Admit: 2018-12-05 | Payer: BC Managed Care – PPO | Source: Home / Self Care | Admitting: Obstetrics & Gynecology

## 2018-12-05 ENCOUNTER — Ambulatory Visit (INDEPENDENT_AMBULATORY_CARE_PROVIDER_SITE_OTHER): Payer: BC Managed Care – PPO | Admitting: Advanced Practice Midwife

## 2018-12-05 VITALS — BP 101/67 | HR 92 | Wt 164.0 lb

## 2018-12-05 DIAGNOSIS — Z1389 Encounter for screening for other disorder: Secondary | ICD-10-CM

## 2018-12-05 DIAGNOSIS — Z331 Pregnant state, incidental: Secondary | ICD-10-CM

## 2018-12-05 DIAGNOSIS — O0993 Supervision of high risk pregnancy, unspecified, third trimester: Secondary | ICD-10-CM

## 2018-12-05 DIAGNOSIS — O0990 Supervision of high risk pregnancy, unspecified, unspecified trimester: Secondary | ICD-10-CM

## 2018-12-05 DIAGNOSIS — Z3A39 39 weeks gestation of pregnancy: Secondary | ICD-10-CM

## 2018-12-05 LAB — POCT URINALYSIS DIPSTICK OB
Glucose, UA: NEGATIVE
Ketones, UA: NEGATIVE
Leukocytes, UA: NEGATIVE
Nitrite, UA: NEGATIVE

## 2018-12-05 NOTE — Progress Notes (Signed)
HIGH-RISK PREGNANCY VISIT Patient name: Kristi Anderson MRN [id number]  Date of birth: Feb 21, 1991 Chief Complaint:   High Risk Gestation (NST)  History of Present Illness:   Kristi Anderson is a 28 y.o. G61P1001 female at 63w0dwith an Estimated Date of Delivery: 12/12/18 being seen today for ongoing management of a high-risk pregnancy complicated by AX5QMcurrently on metformin 10077mam/5006mm  Today she reports FBS all but 2 <95  and pp all but 4 <120 .  MIL passed away, funeral was yesterday. Doing ok. Contractions: Irregular.  .  Movement: Present. denies leaking of fluid.  Review of Systems:   Pertinent items are noted in HPI Denies abnormal vaginal discharge w/ itching/odor/irritation, headaches, visual changes, shortness of breath, chest pain, abdominal pain, severe nausea/vomiting, or problems with urination or bowel movements unless otherwise stated above.    Pertinent History Reviewed:  Medical & Surgical Hx:   Past Medical History:  Diagnosis Date  . Allergy    morphine and latex  . Anxiety   . Gestational diabetes   . Preeclampsia, severe 01/10/2016  . Pregnant 06/01/2015  . PTSD (post-traumatic stress disorder)    had dog attack 11/15 had 4 surgeries on left ear  . PTSD (post-traumatic stress disorder)   . Vitamin D deficiency    Past Surgical History:  Procedure Laterality Date  . 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 EXTERNAL EAR SURGERY     Family History  Problem Relation Age of Onset  . Arthritis Mother   . Heart disease Mother   . Clotting disorder Father   . Heart failure Father   . Stroke Maternal Grandmother   . Heart disease Maternal Grandfather   . Early death Maternal Grandfather   . Arthritis Paternal Grandmother   . Diabetes Paternal Grandfather   . Seizures Sister     Current Outpatient Medications:  .  Accu-Chek FastClix Lancets MISC, 1 Device by Percutaneous route 4 (four) times daily., Disp: 100 each, Rfl:  12 .  aspirin EC 81 MG tablet, Take 162 mg by mouth daily. , Disp: , Rfl:  .  blood glucose meter kit and supplies, Dispense based on patient and insurance preference. Check blood sugar four times daily as directed. (FOR ICD-10 E10.9, E11.9)., Disp: 1 each, Rfl: 0 .  glucose blood (ACCU-CHEK GUIDE) test strip, USE TO TEST BLOOD SUGAR 4 TIMES A DAY, Disp: 100 strip, Rfl: 12 .  Lancets (ONETOUCH DELICA PLUS [id number], , Disp: , Rfl:  .  metFORMIN (GLUCOPHAGE) 1000 MG tablet, Take 1 pill (1,000m62mn the morning and 1/2 pill (500mg5mth supper, Disp: 30 tablet, Rfl: 0 .  prenatal vitamin w/FE, FA (PRENATAL 1 + 1) 27-1 MG TABS tablet, Take 1 tablet by mouth daily at 12 noon., Disp: 30 each, Rfl: 12 .  ONETOUCH VERIO test strip, TEST 4 TIMES A DAY (Patient not taking: Reported on 12/05/2018), Disp: 100 strip, Rfl: 6 Social History: Reviewed -  reports that she is a non-smoker but has been exposed to tobacco smoke. She has never used smokeless tobacco.   Physical Assessment:   Vitals:   12/05/18 1403  BP: 101/67  Pulse: 92  Weight: 164 lb (74.4 kg)  Body mass index is 29.05 kg/m.           Physical Examination:   General appearance: alert, well appearing, and in no distress  Mental status: alert, oriented to person, place, and time  Skin: warm &  dry   Extremities: Edema: None    Cardiovascular: normal heart rate noted  Respiratory: normal respiratory effort, no distress  Abdomen: gravid, soft, non-tender  Pelvic: Cervical exam deferred         Fetal Status:     Movement: Present    Fetal Surveillance Testing today: NST: FHR baseline 120 bpm, Variability: moderate, Accelerations:present, Decelerations:  Absent= Cat 1/Reactive   Results for orders placed or performed in visit on 12/05/18 (from the past 24 hour(s))  POC Urinalysis Dipstick OB   Collection Time: 12/05/18  2:08 PM  Result Value Ref Range   Color, UA     Clarity, UA     Glucose, UA Negative Negative   Bilirubin, UA      Ketones, UA neg    Spec Grav, UA     Blood, UA trace    pH, UA     POC,PROTEIN,UA Trace Negative, Trace, Small (1+), Moderate (2+), Large (3+), 4+   Urobilinogen, UA     Nitrite, UA neg    Leukocytes, UA Negative Negative   Appearance     Odor      Assessment & Plan:  1) High-risk pregnancy G2P1001 at 80w0dwith an Estimated Date of Delivery: 12/12/18   3) A2DM, stable.  Treatment Plan:  IOL set for 9/6 at 0600  ll questions were answered. Has  home bp cuff. Check bp weekly, let uKoreaknow if >140/90.   Future Appointments  Date Time Provider DJennings 12/06/2018  8:00 AM [facility]-MAU 1 [facility]-IN[facility] None  12/08/2018  8:00 AM [facility] [place] None    Orders Placed This Encounter  Procedures  . POC Urinalysis Dipstick OB   FChristin FudgeCNM 12/05/2018 2:28 PM

## 2018-12-06 ENCOUNTER — Other Ambulatory Visit (HOSPITAL_COMMUNITY)
Admission: RE | Admit: 2018-12-06 | Discharge: 2018-12-06 | Disposition: A | Discharge status: Home / Self Care | Payer: BC Managed Care – PPO | Source: Ambulatory Visit | Attending: Family Medicine | Admitting: Family Medicine

## 2018-12-06 ENCOUNTER — Other Ambulatory Visit: Payer: Self-pay

## 2018-12-06 DIAGNOSIS — Z20828 Contact with and (suspected) exposure to other viral communicable diseases: Secondary | ICD-10-CM | POA: Insufficient documentation

## 2018-12-06 LAB — SARS CORONAVIRUS 2 (TAT 6-24 HRS): SARS Coronavirus 2: NEGATIVE

## 2018-12-06 NOTE — MAU Note (Signed)
Asymptomatic, swab collected.  Pt's induction had been rescheduled due to death in family. Asking if will need to be retested since she went to the funeral on Wed, states did wear mask.

## 2018-12-08 ENCOUNTER — Inpatient Hospital Stay (HOSPITAL_COMMUNITY): Payer: BC Managed Care – PPO | Admitting: Anesthesiology

## 2018-12-08 ENCOUNTER — Encounter (HOSPITAL_COMMUNITY): Payer: Self-pay

## 2018-12-08 ENCOUNTER — Inpatient Hospital Stay (HOSPITAL_COMMUNITY): Payer: BC Managed Care – PPO

## 2018-12-08 ENCOUNTER — Inpatient Hospital Stay (HOSPITAL_COMMUNITY)
Admission: AD | Admit: 2018-12-08 | Discharge: 2018-12-10 | DRG: 807 | Disposition: A | Discharge status: Home / Self Care | Payer: BC Managed Care – PPO | Attending: Obstetrics & Gynecology | Admitting: Obstetrics & Gynecology

## 2018-12-08 ENCOUNTER — Other Ambulatory Visit: Payer: Self-pay

## 2018-12-08 DIAGNOSIS — Z6791 Unspecified blood type, Rh negative: Secondary | ICD-10-CM

## 2018-12-08 DIAGNOSIS — Z8632 Personal history of gestational diabetes: Secondary | ICD-10-CM

## 2018-12-08 DIAGNOSIS — Z3042 Encounter for surveillance of injectable contraceptive: Secondary | ICD-10-CM

## 2018-12-08 DIAGNOSIS — O99344 Other mental disorders complicating childbirth: Secondary | ICD-10-CM | POA: Diagnosis present

## 2018-12-08 DIAGNOSIS — Z7722 Contact with and (suspected) exposure to environmental tobacco smoke (acute) (chronic): Secondary | ICD-10-CM | POA: Diagnosis present

## 2018-12-08 DIAGNOSIS — O24425 Gestational diabetes mellitus in childbirth, controlled by oral hypoglycemic drugs: Secondary | ICD-10-CM | POA: Diagnosis present

## 2018-12-08 DIAGNOSIS — F4310 Post-traumatic stress disorder, unspecified: Secondary | ICD-10-CM | POA: Diagnosis present

## 2018-12-08 DIAGNOSIS — O0990 Supervision of high risk pregnancy, unspecified, unspecified trimester: Secondary | ICD-10-CM

## 2018-12-08 DIAGNOSIS — Z3A39 39 weeks gestation of pregnancy: Secondary | ICD-10-CM

## 2018-12-08 DIAGNOSIS — O26893 Other specified pregnancy related conditions, third trimester: Secondary | ICD-10-CM | POA: Diagnosis present

## 2018-12-08 LAB — CBC
HCT: 41.4 % (ref 36.0–46.0)
Hemoglobin: 14.3 g/dL (ref 12.0–15.0)
MCH: 32.1 pg (ref 26.0–34.0)
MCHC: 34.5 g/dL (ref 30.0–36.0)
MCV: 93 fL (ref 80.0–100.0)
Platelets: 183 10*3/uL (ref 150–400)
RBC: 4.45 MIL/uL (ref 3.87–5.11)
RDW: 13.5 % (ref 11.5–15.5)
WBC: 11.2 10*3/uL — ABNORMAL HIGH (ref 4.0–10.5)
nRBC: 0 % (ref 0.0–0.2)

## 2018-12-08 LAB — GLUCOSE, CAPILLARY
Glucose-Capillary: 72 mg/dL (ref 70–99)
Glucose-Capillary: 70 mg/dL (ref 70–99)
Glucose-Capillary: 72 mg/dL (ref 70–99)

## 2018-12-08 LAB — RPR: RPR Ser Ql: NONREACTIVE

## 2018-12-08 MED ORDER — ACETAMINOPHEN 325 MG PO TABS: 650.0000 mg | ORAL_TABLET | ORAL | Status: DC | PRN

## 2018-12-08 MED ORDER — OXYTOCIN 40 UNITS IN NORMAL SALINE INFUSION - SIMPLE MED
1.0000 m[IU]/min | INTRAVENOUS | Status: DC
  Administered 2018-12-08: 8 m[IU]/min via INTRAVENOUS
  Administered 2018-12-08: 16:00:00 2 m[IU]/min via INTRAVENOUS
  Administered 2018-12-08: 6 m[IU]/min via INTRAVENOUS
  Administered 2018-12-08: 10 m[IU]/min via INTRAVENOUS
  Administered 2018-12-08: 4 m[IU]/min via INTRAVENOUS
  Filled 2018-12-08: qty 1000

## 2018-12-08 MED ORDER — COCONUT OIL OIL: 1.0000 "application " | TOPICAL_OIL | Status: DC | PRN

## 2018-12-08 MED ORDER — MISOPROSTOL 50MCG HALF TABLET
50.0000 ug | ORAL_TABLET | ORAL | Status: DC | PRN
  Administered 2018-12-08: 50 ug via ORAL
  Filled 2018-12-08: qty 1

## 2018-12-08 MED ORDER — LIDOCAINE HCL (PF) 1 % IJ SOLN
INTRAMUSCULAR | Status: DC | PRN
  Administered 2018-12-08 (×2): 4 mL via EPIDURAL

## 2018-12-08 MED ORDER — IBUPROFEN 600 MG PO TABS
600.0000 mg | ORAL_TABLET | Freq: Three times a day (TID) | ORAL | Status: DC | PRN
  Administered 2018-12-08 – 2018-12-10 (×5): 600 mg via ORAL
  Filled 2018-12-08 (×5): qty 1

## 2018-12-08 MED ORDER — OXYTOCIN 40 UNITS IN NORMAL SALINE INFUSION - SIMPLE MED: 2.5000 [IU]/h | INTRAVENOUS | Status: DC

## 2018-12-08 MED ORDER — PHENYLEPHRINE 40 MCG/ML (10ML) SYRINGE FOR IV PUSH (FOR BLOOD PRESSURE SUPPORT): 80.0000 ug | PREFILLED_SYRINGE | INTRAVENOUS | Status: DC | PRN

## 2018-12-08 MED ORDER — FENTANYL-BUPIVACAINE-NACL 0.5-0.125-0.9 MG/250ML-% EP SOLN
12.0000 mL/h | EPIDURAL | Status: DC | PRN
  Filled 2018-12-08: qty 250

## 2018-12-08 MED ORDER — TERBUTALINE SULFATE 1 MG/ML IJ SOLN: 0.2500 mg | Freq: Once | INTRAMUSCULAR | Status: DC | PRN

## 2018-12-08 MED ORDER — LACTATED RINGERS IV SOLN
500.0000 mL | Freq: Once | INTRAVENOUS | Status: AC
  Administered 2018-12-08: 20:00:00 500 mL via INTRAVENOUS

## 2018-12-08 MED ORDER — SODIUM CHLORIDE (PF) 0.9 % IJ SOLN
INTRAMUSCULAR | Status: DC | PRN
  Administered 2018-12-08: 12 mL/h via EPIDURAL

## 2018-12-08 MED ORDER — ONDANSETRON HCL 4 MG/2ML IJ SOLN: 4.0000 mg | Freq: Four times a day (QID) | INTRAMUSCULAR | Status: DC | PRN

## 2018-12-08 MED ORDER — DIPHENHYDRAMINE HCL 25 MG PO CAPS: 25.0000 mg | ORAL_CAPSULE | Freq: Four times a day (QID) | ORAL | Status: DC | PRN

## 2018-12-08 MED ORDER — MISOPROSTOL 25 MCG QUARTER TABLET: 25.0000 ug | ORAL_TABLET | ORAL | Status: DC | PRN

## 2018-12-08 MED ORDER — EPHEDRINE 5 MG/ML INJ: 10.0000 mg | INTRAVENOUS | Status: DC | PRN

## 2018-12-08 MED ORDER — SIMETHICONE 80 MG PO CHEW: 80.0000 mg | CHEWABLE_TABLET | ORAL | Status: DC | PRN

## 2018-12-08 MED ORDER — PRENATAL MULTIVITAMIN CH
1.0000 | ORAL_TABLET | Freq: Every day | ORAL | Status: DC
  Administered 2018-12-09 – 2018-12-10 (×2): 1 via ORAL
  Filled 2018-12-08 (×2): qty 1

## 2018-12-08 MED ORDER — LACTATED RINGERS IV SOLN
INTRAVENOUS | Status: DC
  Administered 2018-12-08 (×3): via INTRAVENOUS

## 2018-12-08 MED ORDER — DIBUCAINE (PERIANAL) 1 % EX OINT: 1.0000 "application " | TOPICAL_OINTMENT | CUTANEOUS | Status: DC | PRN

## 2018-12-08 MED ORDER — SOD CITRATE-CITRIC ACID 500-334 MG/5ML PO SOLN: 30.0000 mL | ORAL | Status: DC | PRN

## 2018-12-08 MED ORDER — OXYCODONE-ACETAMINOPHEN 5-325 MG PO TABS: 2.0000 | ORAL_TABLET | ORAL | Status: DC | PRN

## 2018-12-08 MED ORDER — BENZOCAINE-MENTHOL 20-0.5 % EX AERO
1.0000 "application " | INHALATION_SPRAY | CUTANEOUS | Status: DC | PRN
  Administered 2018-12-09: 1 via TOPICAL
  Filled 2018-12-08: qty 56

## 2018-12-08 MED ORDER — LIDOCAINE HCL (PF) 1 % IJ SOLN
30.0000 mL | INTRAMUSCULAR | Status: AC | PRN
  Administered 2018-12-08: 30 mL via SUBCUTANEOUS
  Filled 2018-12-08: qty 30

## 2018-12-08 MED ORDER — WITCH HAZEL-GLYCERIN EX PADS: 1.0000 "application " | MEDICATED_PAD | CUTANEOUS | Status: DC | PRN

## 2018-12-08 MED ORDER — FLEET ENEMA 7-19 GM/118ML RE ENEM: 1.0000 | ENEMA | RECTAL | Status: DC | PRN

## 2018-12-08 MED ORDER — OXYTOCIN BOLUS FROM INFUSION
500.0000 mL | Freq: Once | INTRAVENOUS | Status: AC
  Administered 2018-12-08: 500 mL via INTRAVENOUS

## 2018-12-08 MED ORDER — ONDANSETRON HCL 4 MG/2ML IJ SOLN: 4.0000 mg | INTRAMUSCULAR | Status: DC | PRN

## 2018-12-08 MED ORDER — SENNOSIDES-DOCUSATE SODIUM 8.6-50 MG PO TABS
2.0000 | ORAL_TABLET | ORAL | Status: DC
  Administered 2018-12-09 (×2): 2 via ORAL
  Filled 2018-12-08 (×2): qty 2

## 2018-12-08 MED ORDER — ONDANSETRON HCL 4 MG PO TABS: 4.0000 mg | ORAL_TABLET | ORAL | Status: DC | PRN

## 2018-12-08 MED ORDER — ACETAMINOPHEN 325 MG PO TABS
650.0000 mg | ORAL_TABLET | Freq: Four times a day (QID) | ORAL | Status: DC | PRN
  Administered 2018-12-09 – 2018-12-10 (×3): 650 mg via ORAL
  Filled 2018-12-08 (×3): qty 2

## 2018-12-08 MED ORDER — FENTANYL CITRATE (PF) 100 MCG/2ML IJ SOLN
50.0000 ug | INTRAMUSCULAR | Status: DC | PRN
  Administered 2018-12-08 (×4): 100 ug via INTRAVENOUS
  Filled 2018-12-08 (×4): qty 2

## 2018-12-08 MED ORDER — HYDROXYZINE HCL 50 MG PO TABS: 50.0000 mg | ORAL_TABLET | Freq: Four times a day (QID) | ORAL | Status: DC | PRN

## 2018-12-08 MED ORDER — MEASLES, MUMPS & RUBELLA VAC IJ SOLR: 0.5000 mL | Freq: Once | INTRAMUSCULAR | Status: DC

## 2018-12-08 MED ORDER — OXYCODONE-ACETAMINOPHEN 5-325 MG PO TABS: 1.0000 | ORAL_TABLET | ORAL | Status: DC | PRN

## 2018-12-08 MED ORDER — LACTATED RINGERS IV SOLN: 500.0000 mL | INTRAVENOUS | Status: DC | PRN

## 2018-12-08 MED ORDER — DIPHENHYDRAMINE HCL 50 MG/ML IJ SOLN: 12.5000 mg | INTRAMUSCULAR | Status: DC | PRN

## 2018-12-08 MED ORDER — TETANUS-DIPHTH-ACELL PERTUSSIS 5-2.5-18.5 LF-MCG/0.5 IM SUSP: 0.5000 mL | Freq: Once | INTRAMUSCULAR | Status: DC

## 2018-12-08 NOTE — Progress Notes (Signed)
Kristi Anderson is a 28 y.o. female G2P1001 with IUP at [redacted]w[redacted]d by LMP presenting for IOL for gestational diabetes.   Subjective: Feeling mild contractions   Objective: BP 127/78   Pulse 76   Temp 98 F (36.7 C) (Oral)   Resp 18   Ht 5\' 3"  (1.6 m)   Wt 73.9 kg   LMP 03/07/2018 (Exact Date)   BMI 28.87 kg/m  No intake/output data recorded. No intake/output data recorded.  FHT:  FHR: 125 bpm, variability: moderate,  accelerations:  Present,  decelerations:  Absent UC:   regular, every 1-2 minutes SVE:   Dilation: 3 Effacement (%): 80 Station: -2 Exam by:: Sia Johnny RN, Krystal Eaton RN  Labs: Lab Results  Component Value Date   WBC 11.2 (H) 12/08/2018   HGB 14.3 12/08/2018   HCT 41.4 12/08/2018   MCV 93.0 12/08/2018   PLT 183 12/08/2018    Assessment / Plan: Kristi Anderson is a 28 y.o. female G2P1001 with IUP at [redacted]w[redacted]d by LMP presenting for IOL for gestational diabetes.   Labor: s/p 1 X cytotec. Started pit at 16:10. Continue pitocin. Consider AROM.  Fetal Wellbeing:  Category I fetal tracing reassuring  Pain Control:  IV pain meds I/D:  n/a Anticipated MOD:  NSVD anticipated. c-section if maternal/fetal indication   Lattie Haw MD PGY-1, [facility]  12/08/2018, 4:20 PM

## 2018-12-08 NOTE — H&P (Addendum)
OBSTETRIC ADMISSION HISTORY AND PHYSICAL  Kristi Anderson is a 28 y.o. female G2P1001 with IUP at 48w3dby LMP presenting for IOL for gestational diabetes.   Pt feels well. Reports good fetal movement. Denies vaginal bleeding or leaking fluid. Has yellow/white discharge. Would like to bottle feed. Considering Nexplanon as birth control. Would like circumcision in hospital for baby boy. Denies headache, visual changes, RUQ pain, chest pain, SOB, cough, fevers or voiding sx.  [facility]  Support person in labor: WMry Kristi Anderson  Ultrasounds . Anatomy U/S: 33rd percentile baby  Prenatal History/Complications: Gestational DM: started on Metformin approx 1 month ago, 1g am, 519mpm. CBGs-100-130 G1P1 1# term, 10/10/1738w4d, 2750g, SVD. Hx of pre-eclampsia   Past Medical History: Past Medical History:  Diagnosis Date  . Allergy    morphine and latex  . Anxiety   . Gestational diabetes   . Preeclampsia, severe 01/10/2016  . Pregnant 06/01/2015  . PTSD (post-traumatic stress disorder)    had dog attack 11/15 had 4 surgeries on left ear  . PTSD (post-traumatic stress disorder)   . Vitamin D deficiency     Past Surgical History: Past Surgical History:  Procedure Laterality Date  . 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 EXTERNAL EAR SURGERY      Obstetrical History: OB History    Gravida  2   Para  1   Term  1   Preterm  0   AB  0   Living  1     SAB  0   TAB  0   Ectopic  0   Multiple  0   Live Births  1           Social History: Social History   Socioeconomic History  . Marital status: Married    Spouse name: wiAgricultural engineer. Number of children: 1  . Years of education: Not on file  . Highest education level: Associate degree: academic program  Occupational History  . Not on file  Social Needs  . Financial resource strain: Somewhat hard  . Food insecurity    Worry: Sometimes  true    Inability: Sometimes true  . Transportation needs    Medical: No    Non-medical: No  Tobacco Use  . Smoking status: Passive Smoke Exposure - Never Smoker  . Smokeless tobacco: Never Used  Substance and Sexual Activity  . Alcohol use: No    Alcohol/week: 0.0 standard drinks  . Drug use: No  . Sexual activity: Yes    Partners: Male    Birth control/protection: None  Lifestyle  . Physical activity    Days per week: 3 days    Minutes per session: 20 min  . Stress: Only a little  Relationships  . Social coHerbalistn phone: Three times a week    Gets together: Once a week    Attends religious service: Never    Active member of club or organization: No    Attends meetings of clubs or organizations: Not on file    Relationship status: Married  Other Topics Concern  . Not on file  Social History Narrative   ** Merged History Encounter **        Family History: Family History  Problem Relation Age of Onset  . Arthritis Mother   . Heart disease Mother   . Clotting disorder Father   .  Heart failure Father   . Stroke Maternal Grandmother   . Heart disease Maternal Grandfather   . Early death Maternal Grandfather   . Arthritis Paternal Grandmother   . Diabetes Paternal Grandfather   . Seizures Sister     Allergies: Allergies  Allergen Reactions  . Morphine And Related Nausea Only  . Latex Swelling    Medications Prior to Admission  Medication Sig Dispense Refill Last Dose  . Accu-Chek FastClix Lancets MISC 1 Device by Percutaneous route 4 (four) times daily. 100 each 12   . aspirin EC 81 MG tablet Take 162 mg by mouth daily.      . blood glucose meter kit and supplies Dispense based on patient and insurance preference. Check blood sugar four times daily as directed. (FOR ICD-10 E10.9, E11.9). 1 each 0   . glucose blood (ACCU-CHEK GUIDE) test strip USE TO TEST BLOOD SUGAR 4 TIMES A DAY 100 strip 12   . Lancets (ONETOUCH DELICA PLUS [id number]) MISC       . metFORMIN (GLUCOPHAGE) 1000 MG tablet Take 1 pill (1,067m) in the morning and 1/2 pill (5069m with supper 30 tablet 0   . ONETOUCH VERIO test strip TEST 4 TIMES A DAY (Patient not taking: Reported on 12/05/2018) 100 strip 6   . prenatal vitamin w/FE, FA (PRENATAL 1 + 1) 27-1 MG TABS tablet Take 1 tablet by mouth daily at 12 noon. 30 each 12      Review of Systems  All systems reviewed and negative except as stated in HPI  Blood pressure 118/82, pulse 86, resp. rate 16, last menstrual period 03/07/2018. General appearance: alert, cooperative and appears stated age Lungs: chest clear, no crackles or wheeze, no respiratory distress Heart: RRR, no rubs or gallops  Abdomen: soft, non-tender; gravid abdomen, bowel sounds present  Pelvic: 2, 50, -2 Extremities: Homans sign is negative, no sign of DVT Presentation: cephalic Fetal monitoring: 145 bpm, accels present, mod variability, no decels.  Uterine activity: occasional on toco     Prenatal labs: ABO, Rh: A/Negative/-- (03/05 1013) Antibody: Negative (07/06 0855) Rubella: 6.15 (03/05 1013) RPR: Non Reactive (07/06 0855)  HBsAg: Negative (03/05 1013)  HIV: Non Reactive (07/06 0855)  GBS:   negative Glucola: 84, 193,120 Genetic screening:  Neg  Prenatal Transfer Tool  Maternal Diabetes: Yes:  Diabetes Type:  Insulin/Medication controlled Genetic Screening: Normal Maternal Ultrasounds/Referrals: Other: Fetal Ultrasounds or other Referrals:  Referred to Materal Fetal Medicine  Maternal Substance Abuse:  No Significant Maternal Medications:  Meds include: Other: metformin  Significant Maternal Lab Results: None  No results found for this or any previous visit (from the past 24 hour(s)).  Patient Active Problem List   Diagnosis Date Noted  . Gestational diabetes mellitus, class A2 10/08/2018  . Rh negative state in antepartum period 06/07/2018  . Supervision of high risk pregnancy, antepartum 06/06/2018  . Hx of  preeclampsia, prior pregnancy, currently pregnant 04/26/2018  . Vitamin D deficiency 05/03/2015  . Post traumatic stress disorder 11/18/2014    Assessment/Plan:  ElAaryana Anderson a [age].o. G2P1001 at 3957w3dre for IOL of GDM.  Labor: Oral cytotec. Will recheck in 4 hrs. If good cervical response and more dilated then can start Pitocin. Consider FB if no cervical change  -- pain control: would like to try naturally if possible. Considering epidural if needed.  Fetal Wellbeing:  -- GBS (negative) -- continuous fetal monitoring  --circumcision desired: yes  Postpartum Planning --Bottle feeding --Contraception: Nexplanon  Anticipate vaginal delivery  Lattie Haw, MD PGY-1, [facility]   GME ATTESTATION:  I saw and evaluated the patient. I agree with the findings and the plan of care as documented in the resident's note.  Tyheim Vanalstyne L, DO 12/08/2018 5:06 PM

## 2018-12-08 NOTE — Progress Notes (Signed)
Kristi Cooperis a 28 y.o.femaleG2P1001 with IUP at [redacted]w[redacted]d by LMPpresenting for IOL for gestational diabetes.  Subjective: Feeling more contractions. AROM at 19:30 by Dr Darene Lamer, passed clear liquid.  Objective: BP 127/79   Pulse 73   Temp 98.1 F (36.7 C) (Oral)   Resp 18   Ht 5\' 3"  (1.6 m)   Wt 73.9 kg   LMP 03/07/2018 (Exact Date)   BMI 28.87 kg/m  No intake/output data recorded. No intake/output data recorded.  FHT:  FHR: 125 bpm, variability: moderate,  accelerations:  Present,  decelerations:  Absent UC:   regular, every 1-3 minutes SVE:   Dilation: 4.5 Effacement (%): 80 Station: -1 Exam by:: Franchot Erichsen, RNC  Labs: Lab Results  Component Value Date   WBC 11.2 (H) 12/08/2018   HGB 14.3 12/08/2018   HCT 41.4 12/08/2018   MCV 93.0 12/08/2018   PLT 183 12/08/2018    Assessment / Plan: Kristi Cooperis a 28 y.o.femaleG2P1001 with IUP at [redacted]w[redacted]d by LMPpresenting for IOL for gestational diabetes.  Labor: S/p 1 X cytotec. Started pit at 16:10. Continue pitocin. AROM at 19:30 by Dr Darene Lamer. Passed clear liquid. Fetal Wellbeing:  Category I reassuring fetal tracing  Pain Control:  IV pain meds declining epidural I/D:  n/a Anticipated MOD:  NSVD anticipated. c-section if maternal/fetal indication.  Kristi Haw MD PGY-1, [facility]  12/08/2018, 7:33 PM

## 2018-12-08 NOTE — Anesthesia Preprocedure Evaluation (Addendum)
Anesthesia Evaluation  Patient identified by MRN, date of birth, ID band Patient awake    Reviewed: Allergy & Precautions, Patient's Chart, lab work & pertinent test results  History of Anesthesia Complications Negative for: history of anesthetic complications  Airway Mallampati: II  TM Distance: >3 FB Neck ROM: Full    Dental no notable dental hx.    Pulmonary neg pulmonary ROS,    Pulmonary exam normal        Cardiovascular hypertension, Normal cardiovascular exam     Neuro/Psych Anxiety negative neurological ROS     GI/Hepatic negative GI ROS, Neg liver ROS,   Endo/Other  diabetes, Gestational  Renal/GU negative Renal ROS     Musculoskeletal negative musculoskeletal ROS (+)   Abdominal   Peds  Hematology negative hematology ROS (+)   Anesthesia Other Findings   Reproductive/Obstetrics (+) Pregnancy                           Anesthesia Physical Anesthesia Plan  ASA: II  Anesthesia Plan: Epidural   Post-op Pain Management:    Induction:   PONV Risk Score and Plan: Treatment may vary due to age or medical condition  Airway Management Planned: Natural Airway  Additional Equipment:   Intra-op Plan:   Post-operative Plan:   Informed Consent: I have reviewed the patients History and Physical, chart, labs and discussed the procedure including the risks, benefits and alternatives for the proposed anesthesia with the patient or authorized representative who has indicated his/her understanding and acceptance.       Plan Discussed with:   Anesthesia Plan Comments:         Anesthesia Quick Evaluation

## 2018-12-08 NOTE — Anesthesia Procedure Notes (Signed)
Epidural Patient location during procedure: OB Start time: 12/08/2018 8:13 PM End time: 12/08/2018 8:15 PM  Staffing Anesthesiologist: Brennan Bailey, MD Performed: anesthesiologist   Preanesthetic Checklist Completed: patient identified, pre-op evaluation, timeout performed, IV checked, risks and benefits discussed and monitors and equipment checked  Epidural Patient position: sitting Prep: site prepped and draped and DuraPrep Patient monitoring: continuous pulse ox, blood pressure and heart rate Approach: midline Location: L3-L4 Injection technique: LOR air  Needle:  Needle type: Tuohy  Needle gauge: 17 G Needle length: 9 cm Needle insertion depth: 5 cm Catheter type: closed end flexible Catheter size: 19 Gauge Catheter at skin depth: 10 cm Test dose: negative and Other (1% lidocaine)  Assessment Events: blood not aspirated, injection not painful, no injection resistance, negative IV test and no paresthesia  Additional Notes Patient identified. Risks, benefits, and alternatives discussed with patient including but not limited to bleeding, infection, nerve damage, paralysis, failed block, incomplete pain control, headache, blood pressure changes, nausea, vomiting, reactions to medication, itching, and postpartum back pain. Confirmed with bedside nurse the patient's most recent platelet count. Confirmed with patient that they are not currently taking any anticoagulation, have any bleeding history, or any family history of bleeding disorders. Patient expressed understanding and wished to proceed. All questions were answered. Sterile technique was used throughout the entire procedure. Please see nursing notes for vital signs.   Crisp LOR on first pass. Test dose was given through epidural catheter and negative prior to continuing to dose epidural or start infusion. Warning signs of high block given to the patient including shortness of breath, tingling/numbness in hands, complete  motor block, or any concerning symptoms with instructions to call for help. Patient was given instructions on fall risk and not to get out of bed. All questions and concerns addressed with instructions to call with any issues or inadequate analgesia.  Reason for block:procedure for pain

## 2018-12-08 NOTE — Progress Notes (Signed)
Addysyn Cooperis a 28 y.o.femaleG2P1001 with IUP at [redacted]w[redacted]d by LMPpresenting for IOL for gestational diabetes.  Subjective: Just received Epidural and has not yet gotten much relief.   Objective: BP 130/73   Pulse 82   Temp 98.1 F (36.7 C) (Oral)   Resp 18   Ht 5\' 3"  (1.6 m)   Wt 73.9 kg   LMP 03/07/2018 (Exact Date)   SpO2 98%   BMI 28.87 kg/m  No intake/output data recorded. No intake/output data recorded.  FHT:  FHR: 125 bpm, variability: moderate,  accelerations:  Present,  decelerations:  Absent UC:   regular, every 2-3 minutes SVE:   Dilation: 4.5 Effacement (%): 80 Station: -1 Exam by:: Franchot Erichsen, RNC  Labs: Lab Results  Component Value Date   WBC 11.2 (H) 12/08/2018   HGB 14.3 12/08/2018   HCT 41.4 12/08/2018   MCV 93.0 12/08/2018   PLT 183 12/08/2018    Assessment / Plan: Kailynn Cooperis a 28 y.o.femaleG2P1001 with IUP at [redacted]w[redacted]d by LMPpresenting for IOL for gestational diabetes.  Labor: S/p 1 X cytotec. Started pit at 16:10. AROM at 19:30. Now 7-8 per RN. Anticipate SVD.  Fetal Wellbeing:  Category I EFW: 3200g Pain Control:  Epidural  I/D:  GBS neg  Barrington Ellison, MD OB Family Medicine Fellow, [facility], [place]

## 2018-12-08 NOTE — Discharge Summary (Addendum)
Postpartum Discharge Summary    Patient Name: Kristi Anderson DOB: May 19, 1990 MRN: [id number]  Date of admission: 12/08/2018 Delivering Provider: Chauncey Mann   Date of discharge: 12/10/2018  Admitting diagnosis: pregnancy Intrauterine pregnancy: [redacted]w[redacted]d    Secondary diagnosis:  Principal Problem:   Spontaneous vaginal delivery Active Problems:   Gestational diabetes mellitus, class A2   [redacted] weeks gestation of pregnancy   Depo-Provera contraceptive status   Second degree perineal laceration  Additional problems: None     Discharge diagnosis: Term Pregnancy Delivered and GDM A2                                                                                                Post partum procedures: Depo  Augmentation: AROM, Pitocin and Cytotec  Complications: None  Hospital course:  Induction of Labor With Vaginal Delivery   28y.o. yo G2P1001 at 330w3das admitted to the hospital 12/08/2018 for induction of labor.  Indication for induction: A2 DM.  Patient had an uncomplicated labor course as follows. [facility]. Patient had Cytotec, Pitocin and AROM. She received an Epidural. She then progressed to complete.  Membrane Rupture Time/Date: 7:27 PM ,12/08/2018   Intrapartum Procedures: Episiotomy: Left Mediolateral [3]                                         Lacerations:  2nd degree [3];Perineal [11]  Patient had delivery of a Viable infant.  Information for the patient's newborn:  CoLinley, Moskal0[[id number]]Delivery Method: Vag-Spont    12/08/2018  Details of delivery can be found in separate delivery note.  Patient had a routine postpartum course. Patient is discharged home 12/10/18. Delivery time: 9:07 PM    Magnesium Sulfate received: No BMZ received: No Rhophylac: rhogam received MMR:No Transfusion:No  Physical exam  Vitals:   12/09/18 1133 12/09/18 1423 12/09/18 2132 12/10/18 0525  BP: 105/71 112/73  124/78 112/74  Pulse: 77 71 71 70  Resp: '17 16 18 16  ' Temp: 98.1 F (36.7 C) 98.4 F (36.9 C) 98.7 F (37.1 C) 97.8 F (36.6 C)  TempSrc: Oral Oral Oral Axillary  SpO2: 99% 100%  99%  Weight:      Height:       General: alert, cooperative and no distress  Chest: Lungs CTA, Heart RRR Abdomen: Soft,NonTender, NonDistended, BS x 4Q Lochia: appropriate Uterine Fundus: firm at Umbilicus Incision: N/A DVT Evaluation: No cords or calf tenderness. No significant calf/ankle edema. Labs: Lab Results  Component Value Date   WBC 11.2 (H) 12/08/2018   HGB 14.3 12/08/2018   HCT 41.4 12/08/2018   MCV 93.0 12/08/2018   PLT 183 12/08/2018   CMP Latest Ref Rng & Units 06/06/2018  Glucose 65 - 99 mg/dL 95  BUN 6 - 20 mg/dL 6  Creatinine 0.57 - 1.00 mg/dL 0.55(L)  Sodium 134 - 144 mmol/L 138  Potassium 3.5 - 5.2 mmol/L 4.3  Chloride 96 -  106 mmol/L 100  CO2 20 - 29 mmol/L 19(L)  Calcium 8.7 - 10.2 mg/dL 9.3  Total Protein 6.0 - 8.5 g/dL 6.8  Total Bilirubin 0.0 - 1.2 mg/dL 0.3  Alkaline Phos 39 - 117 IU/L 49  AST 0 - 40 IU/L 32  ALT 0 - 32 IU/L 47(H)    Discharge instruction: per After Visit Summary and "Baby and Me Booklet". Pain Management, Peri-Care, Breastfeeding, Who and When to call for postpartum complications. Information Sheet(s) given Depo Provera and Care after Vaginal Delivery.  -Discussed possible irregularities in bleeding pattern s/t depo provera injections.    After visit meds:  Allergies as of 12/10/2018      Reactions   Morphine And Related Nausea Only   Latex Swelling      Medication List    STOP taking these medications   Accu-Chek FastClix Lancets Misc   Accu-Chek Guide test strip Generic drug: glucose blood   blood glucose meter kit and supplies   metFORMIN 1000 MG tablet Commonly known as: GLUCOPHAGE   OneTouch Delica Plus [id number] Misc   OneTouch Verio test strip Generic drug: glucose blood     TAKE these medications   acetaminophen 325  MG tablet Commonly known as: Tylenol Take 2 tablets (650 mg total) by mouth every 6 (six) hours as needed (for pain scale < 4).   aspirin EC 81 MG tablet Take 162 mg by mouth daily.   ibuprofen 600 MG tablet Commonly known as: ADVIL Take 1 tablet (600 mg total) by mouth every 8 (eight) hours as needed for mild pain.   prenatal vitamin w/FE, FA 27-1 MG Tabs tablet Take 1 tablet by mouth daily at 12 noon.   senna-docusate 8.6-50 MG tablet Commonly known as: Senokot-S Take 2 tablets by mouth daily. Start taking on: December 11, 2018       Diet: routine modified diet  Activity: Advance as tolerated. Pelvic rest for 6 weeks.   Outpatient follow up:4 weeks Follow up Appt: Future Appointments  Date Time Provider [facility]  01/13/2019 11:10 AM Roma Schanz, CNM [facility]   Follow up Visit:   Please schedule this patient for Postpartum visit in: 4 weeks with the following provider: Any provider High risk pregnancy complicated by: GDM Delivery mode:  SVD Anticipated Birth Control:  Depo PP Procedures needed: 2 hour GTT  Schedule Integrated BH visit: no    Newborn Data: Live born female  Birth Weight:  3371g APGAR: 9, 9  Newborn Delivery   Birth date/time: 12/08/2018 21:07:00 Delivery type: Vaginal, Spontaneous      Baby Feeding: Breast Disposition:home with mother   12/10/2018 Maryann Conners, CNM

## 2018-12-08 NOTE — Progress Notes (Signed)
Kristi Cooperis a 28 y.o.femaleG2P1001 with IUP at [redacted]w[redacted]d by LMPpresenting for IOL for gestational diabetes.  Subjective: Pt feeling more pressure. 8/10 severity contractions. Declined epidural   Objective: BP 130/81   Pulse 73   Temp 98 F (36.7 C) (Oral)   Resp 18   Ht 5\' 3"  (1.6 m)   Wt 73.9 kg   LMP 03/07/2018 (Exact Date)   BMI 28.87 kg/m  No intake/output data recorded. No intake/output data recorded.  FHT:  FHR: 120 bpm, variability: moderate,  accelerations:  Present,  decelerations:  Absent UC:   regular, every 1-3 minutes SVE:   Dilation: 3 Effacement (%): 80 Station: -2 Exam by:: Sia Johnny RN, Krystal Eaton RN  Labs: Lab Results  Component Value Date   WBC 11.2 (H) 12/08/2018   HGB 14.3 12/08/2018   HCT 41.4 12/08/2018   MCV 93.0 12/08/2018   PLT 183 12/08/2018    Assessment / Plan: Kristi Cooperis a 28 y.o.femaleG2P1001 with IUP at [redacted]w[redacted]d by LMPpresenting for IOL for gestational diabetes.  Labor: Progressing normally  s/p 1 X cytotec. Started pit at 16:10. Continue pitocin. Consider AROM. Fetal Wellbeing:  Category I fetal tracing reassuring  Pain Control:  IV pain meds I/D:  n/a Anticipated MOD:  NSVD anticipated, c-section if maternal/fetal indication  Lattie Haw MD PGY-1, [facility]  12/08/2018, 6:36 PM

## 2018-12-09 LAB — GLUCOSE, CAPILLARY
Glucose-Capillary: 74 mg/dL (ref 70–99)
Glucose-Capillary: 65 mg/dL — ABNORMAL LOW (ref 70–99)
Glucose-Capillary: 82 mg/dL (ref 70–99)
Glucose-Capillary: 103 mg/dL — ABNORMAL HIGH (ref 70–99)

## 2018-12-09 MED ORDER — RHO D IMMUNE GLOBULIN 1500 UNIT/2ML IJ SOSY
300.0000 ug | PREFILLED_SYRINGE | Freq: Once | INTRAMUSCULAR | Status: AC
  Administered 2018-12-09: 300 ug via INTRAVENOUS
  Filled 2018-12-09: qty 2

## 2018-12-09 NOTE — Progress Notes (Signed)
I d/w mom and dad re: circ r/b and they desire to proceed. Consent signed  Durene Romans MD [facility] 12/09/2018 Time: [phone]

## 2018-12-09 NOTE — Progress Notes (Signed)
Post Partum Day 1 Subjective: no complaints, up ad lib, voiding, tolerating PO and + flatus  She is bottle feeding. Planning to discharge tomorrow. Some soreness with urination.   Objective: Blood pressure 121/80, pulse 67, temperature 98.4 F (36.9 C), temperature source Oral, resp. rate 18, height 5\' 3"  (1.6 m), weight 73.9 kg, last menstrual period 03/07/2018, SpO2 100 %, unknown if currently breastfeeding.  Physical Exam:  General: alert, cooperative, appears stated age and no distress Lochia: appropriate Uterine Fundus: firm DVT Evaluation: No evidence of DVT seen on physical exam.  Recent Labs    12/08/18 0938  HGB 14.3  HCT 41.4    Assessment/Plan: Plan for discharge tomorrow and Contraception Nexplanon to be placed on day of discharge  Circumcision prior to discharge  Bottle-feeding Pain well-controlled  Baby B pos; mom will need Rhogam prior to dc   LOS: 1 day   Kristi Anderson Kristi Anderson 12/09/2018, 6:44 AM

## 2018-12-09 NOTE — Anesthesia Postprocedure Evaluation (Signed)
Anesthesia Post Note  Patient: Kristi Anderson  Procedure(s) Performed: AN AD [place]     Patient location during evaluation: Mother Baby Anesthesia Type: Epidural Level of consciousness: awake and alert and oriented Pain management: satisfactory to patient Vital Signs Assessment: post-procedure vital signs reviewed and stable Respiratory status: respiratory function stable Cardiovascular status: stable Postop Assessment: no headache, no backache, epidural receding, patient able to bend at knees, no signs of nausea or vomiting and adequate PO intake Anesthetic complications: no    Last Vitals:  Vitals:   12/09/18 0015 12/09/18 0437  BP: 117/72 121/80  Pulse: 88 67  Resp: 17 18  Temp: 36.8 C 36.9 C  SpO2: 99% 100%    Last Pain:  Vitals:   12/09/18 0630  TempSrc:   PainSc: 4    Pain Goal: Patients Stated Pain Goal: 2 (12/08/18 1800)                 [place]

## 2018-12-09 NOTE — Progress Notes (Addendum)
Hypoglycemic Event  CBG: 65  Treatment: can of coke, lunch here  Symptoms: no symptoms  Follow-up CBG: Time:1430 CBG Result:82  Possible Reasons for Event: ordered Lunch late      Kristi Anderson

## 2018-12-09 NOTE — Progress Notes (Signed)
CSW received consult for history of anxiety and PTSD. CSW noted in H&P from this visit that there may be some food insecurities in the home. CSW met with MOB to offer support and complete assessment.    MOB resting in bed with infant asleep in basinet and FOB present at bedside. CSW introduced self and received verbal permission from MOB to complete assessment with FOB present. MOB remembered meeting with a CSW when her daughter was born due to her PTSD following dog attack two years prior. MOB also acknowledged having a history of anxiety but stated she doesn't have it anymore. MOB denied any recent symptoms for either her PTSD or her anxiety. MOB stated she is not currently on medications nor does she feel they are needed and is not receiving counseling. MOB denied any PPD following the birth of her daughter and is aware of the baby blues period vs. perinatal mood disorders, treatment options and resources for mental health follow up if concerns arise.  CSW recommended self-evaluation during the postpartum time period using the New Mom Checklist from Postpartum Progress and encouraged MOB to contact a medical professional if symptoms are noted at any time. MOB denied any current SI or HI and reported having a strong support system consisting of FOB, her five sister-in-laws and three sisters.   MOB and FOB confirmed having all essential items for infant once discharged and reported infant would be sleeping in either his crib or bassinet once home. FOB noted that they were under prepared for the birth of their daughter but feel they are over prepared this time. MOB and FOB confirmed they had adequate food and shelter once discharged. CSW provided review of Sudden Infant Death Syndrome (SIDS) precautions and safe sleeping habits.    CSW identifies no further need for intervention and no barriers to discharge at this time.  Elijio Miles, [place]  [facility] [employer] [phone]

## 2018-12-10 DIAGNOSIS — Z3042 Encounter for surveillance of injectable contraceptive: Secondary | ICD-10-CM

## 2018-12-10 LAB — RH IG WORKUP (INCLUDES ABO/RH)
ABO/RH(D): A NEG
Fetal Screen: NEGATIVE
Gestational Age(Wks): 39.3
Unit division: 0

## 2018-12-10 MED ORDER — ACETAMINOPHEN 325 MG PO TABS: 650.0000 mg | ORAL_TABLET | Freq: Four times a day (QID) | ORAL | Status: DC | PRN

## 2018-12-10 MED ORDER — SENNOSIDES-DOCUSATE SODIUM 8.6-50 MG PO TABS: 2.0000 | ORAL_TABLET | ORAL | Status: DC

## 2018-12-10 MED ORDER — MEDROXYPROGESTERONE ACETATE 150 MG/ML IM SUSP
150.0000 mg | Freq: Once | INTRAMUSCULAR | Status: AC
  Administered 2018-12-10: 150 mg via INTRAMUSCULAR
  Filled 2018-12-10: qty 1

## 2018-12-10 MED ORDER — IBUPROFEN 600 MG PO TABS: 600.0000 mg | ORAL_TABLET | Freq: Three times a day (TID) | ORAL | 0 refills | Status: DC | PRN

## 2018-12-10 NOTE — Discharge Instructions (Signed)
Postpartum Care After Vaginal Delivery °This sheet gives you information about how to care for yourself from the time you deliver your baby to up to 6-12 weeks after delivery (postpartum period). Your health care provider may also give you more specific instructions. If you have problems or questions, contact your health care provider. °Follow these instructions at home: °Vaginal bleeding °· It is normal to have vaginal bleeding (lochia) after delivery. Wear a sanitary pad for vaginal bleeding and discharge. °? During the first week after delivery, the amount and appearance of lochia is often similar to a menstrual period. °? Over the next few weeks, it will gradually decrease to a dry, yellow-brown discharge. °? For most women, lochia stops completely by 4-6 weeks after delivery. Vaginal bleeding can vary from woman to woman. °· Change your sanitary pads frequently. Watch for any changes in your flow, such as: °? A sudden increase in volume. °? A change in color. °? Large blood clots. °· If you pass a blood clot from your vagina, save it and call your health care provider to discuss. Do not flush blood clots down the toilet before talking with your health care provider. °· Do not use tampons or douches until your health care provider says this is safe. °· If you are not breastfeeding, your period should return 6-8 weeks after delivery. If you are feeding your child breast milk only (exclusive breastfeeding), your period may not return until you stop breastfeeding. °Perineal care °· Keep the area between the vagina and the anus (perineum) clean and dry as told by your health care provider. Use medicated pads and pain-relieving sprays and creams as directed. °· If you had a cut in the perineum (episiotomy) or a tear in the vagina, check the area for signs of infection until you are healed. Check for: °? More redness, swelling, or pain. °? Fluid or blood coming from the cut or tear. °? Warmth. °? Pus or a bad  smell. °· You may be given a squirt bottle to use instead of wiping to clean the perineum area after you go to the bathroom. As you start healing, you may use the squirt bottle before wiping yourself. Make sure to wipe gently. °· To relieve pain caused by an episiotomy, a tear in the vagina, or swollen veins in the anus (hemorrhoids), try taking a warm sitz bath 2-3 times a day. A sitz bath is a warm water bath that is taken while you are sitting down. The water should only come up to your hips and should cover your buttocks. °Breast care °· Within the first few days after delivery, your breasts may feel heavy, full, and uncomfortable (breast engorgement). Milk may also leak from your breasts. Your health care provider can suggest ways to help relieve the discomfort. Breast engorgement should go away within a few days. °· If you are breastfeeding: °? Wear a bra that supports your breasts and fits you well. °? Keep your nipples clean and dry. Apply creams and ointments as told by your health care provider. °? You may need to use breast pads to absorb milk that leaks from your breasts. °? You may have uterine contractions every time you breastfeed for up to several weeks after delivery. Uterine contractions help your uterus return to its normal size. °? If you have any problems with breastfeeding, work with your health care provider or lactation consultant. °· If you are not breastfeeding: °? Avoid touching your breasts a lot. Doing this can make   your breasts produce more milk. °? Wear a good-fitting bra and use cold packs to help with swelling. °? Do not squeeze out (express) milk. This causes you to make more milk. °Intimacy and sexuality °· Ask your health care provider when you can engage in sexual activity. This may depend on: °? Your risk of infection. °? How fast you are healing. °? Your comfort and desire to engage in sexual activity. °· You are able to get pregnant after delivery, even if you have not had  your period. If desired, talk with your health care provider about methods of birth control (contraception). °Medicines °· Take over-the-counter and prescription medicines only as told by your health care provider. °· If you were prescribed an antibiotic medicine, take it as told by your health care provider. Do not stop taking the antibiotic even if you start to feel better. °Activity °· Gradually return to your normal activities as told by your health care provider. Ask your health care provider what activities are safe for you. °· Rest as much as possible. Try to rest or take a nap while your baby is sleeping. °Eating and drinking ° °· Drink enough fluid to keep your urine pale yellow. °· Eat high-fiber foods every day. These may help prevent or relieve constipation. High-fiber foods include: °? Whole grain cereals and breads. °? Brown rice. °? Beans. °? Fresh fruits and vegetables. °· Do not try to lose weight quickly by cutting back on calories. °· Take your prenatal vitamins until your postpartum checkup or until your health care provider tells you it is okay to stop. °Lifestyle °· Do not use any products that contain nicotine or tobacco, such as cigarettes and e-cigarettes. If you need help quitting, ask your health care provider. °· Do not drink alcohol, especially if you are breastfeeding. °General instructions °· Keep all follow-up visits for you and your baby as told by your health care provider. Most women visit their health care provider for a postpartum checkup within the first 3-6 weeks after delivery. °Contact a health care provider if: °· You feel unable to cope with the changes that your child brings to your life, and these feelings do not go away. °· You feel unusually sad or worried. °· Your breasts become red, painful, or hard. °· You have a fever. °· You have trouble holding urine or keeping urine from leaking. °· You have little or no interest in activities you used to enjoy. °· You have not  breastfed at all and you have not had a menstrual period for 12 weeks after delivery. °· You have stopped breastfeeding and you have not had a menstrual period for 12 weeks after you stopped breastfeeding. °· You have questions about caring for yourself or your baby. °· You pass a blood clot from your vagina. °Get help right away if: °· You have chest pain. °· You have difficulty breathing. °· You have sudden, severe leg pain. °· You have severe pain or cramping in your lower abdomen. °· You bleed from your vagina so much that you fill more than one sanitary pad in one hour. Bleeding should not be heavier than your heaviest period. °· You develop a severe headache. °· You faint. °· You have blurred vision or spots in your vision. °· You have bad-smelling vaginal discharge. °· You have thoughts about hurting yourself or your baby. °If you ever feel like you may hurt yourself or others, or have thoughts about taking your own life, get help   right away. You can go to the nearest emergency department or call:  Your local emergency services (911 in the [place]  A suicide crisis helpline, such as the [facility] at (386) 113-3520. This is open 24 hours a day. Summary  The period of time right after you deliver your newborn up to 6-12 weeks after delivery is called the postpartum period.  Gradually return to your normal activities as told by your health care provider.  Keep all follow-up visits for you and your baby as told by your health care provider. This information is not intended to replace advice given to you by your health care provider. Make sure you discuss any questions you have with your health care provider. Document Released: 01/15/2007 Document Revised: 03/23/2017 Document Reviewed: 01/01/2017 Elsevier Patient Education  [place]. Medroxyprogesterone injection [Contraceptive] What is this medicine? MEDROXYPROGESTERONE (me DROX ee proe JES te rone)  contraceptive injections prevent pregnancy. They provide effective birth control for 3 months. Depo-subQ Provera 104 is also used for treating pain related to endometriosis. This medicine may be used for other purposes; ask your health care provider or pharmacist if you have questions. COMMON BRAND NAME(S): Depo-Provera, Depo-subQ Provera 104 What should I tell my health care provider before I take this medicine? They need to know if you have any of these conditions:  frequently drink alcohol  asthma  blood vessel disease or a history of a blood clot in the lungs or legs  bone disease such as osteoporosis  breast cancer  diabetes  eating disorder (anorexia nervosa or bulimia)  high blood pressure  HIV infection or AIDS  kidney disease  liver disease  mental depression  migraine  seizures (convulsions)  stroke  tobacco smoker  vaginal bleeding  an unusual or allergic reaction to medroxyprogesterone, other hormones, medicines, foods, dyes, or preservatives  pregnant or trying to get pregnant  breast-feeding How should I use this medicine? Depo-Provera Contraceptive injection is given into a muscle. Depo-subQ Provera 104 injection is given under the skin. These injections are given by a health care professional. You must not be pregnant before getting an injection. The injection is usually given during the first 5 days after the start of a menstrual period or 6 weeks after delivery of a baby. Talk to your pediatrician regarding the use of this medicine in children. Special care may be needed. These injections have been used in female children who have started having menstrual periods. Overdosage: If you think you have taken too much of this medicine contact a poison control center or emergency room at once. NOTE: This medicine is only for you. Do not share this medicine with others. What if I miss a dose? Try not to miss a dose. You must get an injection once every 3  months to maintain birth control. If you cannot keep an appointment, call and reschedule it. If you wait longer than 13 weeks between Depo-Provera contraceptive injections or longer than 14 weeks between Depo-subQ Provera 104 injections, you could get pregnant. Use another method for birth control if you miss your appointment. You may also need a pregnancy test before receiving another injection. What may interact with this medicine? Do not take this medicine with any of the following medications:  bosentan This medicine may also interact with the following medications:  aminoglutethimide  antibiotics or medicines for infections, especially rifampin, rifabutin, rifapentine, and griseofulvin  aprepitant  barbiturate medicines such as phenobarbital or primidone  bexarotene  carbamazepine  medicines  for seizures like ethotoin, felbamate, oxcarbazepine, phenytoin, topiramate  modafinil  St. John's wort This list may not describe all possible interactions. Give your health care provider a list of all the medicines, herbs, non-prescription drugs, or dietary supplements you use. Also tell them if you smoke, drink alcohol, or use illegal drugs. Some items may interact with your medicine. What should I watch for while using this medicine? This drug does not protect you against HIV infection (AIDS) or other sexually transmitted diseases. Use of this product may cause you to lose calcium from your bones. Loss of calcium may cause weak bones (osteoporosis). Only use this product for more than 2 years if other forms of birth control are not right for you. The longer you use this product for birth control the more likely you will be at risk for weak bones. Ask your health care professional how you can keep strong bones. You may have a change in bleeding pattern or irregular periods. Many females stop having periods while taking this drug. If you have received your injections on time, your chance of  being pregnant is very low. If you think you may be pregnant, see your health care professional as soon as possible. Tell your health care professional if you want to get pregnant within the next year. The effect of this medicine may last a long time after you get your last injection. What side effects may I notice from receiving this medicine? Side effects that you should report to your doctor or health care professional as soon as possible:  allergic reactions like skin rash, itching or hives, swelling of the face, lips, or tongue  breast tenderness or discharge  breathing problems  changes in vision  depression  feeling faint or lightheaded, falls  fever  pain in the abdomen, chest, groin, or leg  problems with balance, talking, walking  unusually weak or tired  yellowing of the eyes or skin Side effects that usually do not require medical attention (report to your doctor or health care professional if they continue or are bothersome):  acne  fluid retention and swelling  headache  irregular periods, spotting, or absent periods  temporary pain, itching, or skin reaction at site where injected  weight gain This list may not describe all possible side effects. Call your doctor for medical advice about side effects. You may report side effects to FDA at 1-800-FDA-1088. Where should I keep my medicine? This does not apply. The injection will be given to you by a health care professional. NOTE: This sheet is a summary. It may not cover all possible information. If you have questions about this medicine, talk to your doctor, pharmacist, or health care provider.  2020 Elsevier/Gold Standard (2008-04-10 18:37:56)

## 2018-12-12 LAB — TYPE AND SCREEN
ABO/RH(D): A NEG
Antibody Screen: POSITIVE
Unit division: 0
Unit division: 0

## 2018-12-12 LAB — BPAM RBC
Blood Product Expiration Date: 202010012359
Blood Product Expiration Date: 202010012359
Unit Type and Rh: 600
Unit Type and Rh: 600

## 2019-01-12 ENCOUNTER — Encounter: Payer: Self-pay | Admitting: *Deleted

## 2019-01-13 ENCOUNTER — Telehealth (INDEPENDENT_AMBULATORY_CARE_PROVIDER_SITE_OTHER): Payer: BC Managed Care – PPO | Admitting: Women's Health

## 2019-01-13 ENCOUNTER — Encounter: Payer: Self-pay | Admitting: Women's Health

## 2019-01-13 ENCOUNTER — Other Ambulatory Visit: Payer: Self-pay

## 2019-01-13 DIAGNOSIS — O09299 Supervision of pregnancy with other poor reproductive or obstetric history, unspecified trimester: Secondary | ICD-10-CM

## 2019-01-13 DIAGNOSIS — Z8632 Personal history of gestational diabetes: Secondary | ICD-10-CM

## 2019-01-13 NOTE — Progress Notes (Addendum)
TELEHEALTH VIRTUAL POSTPARTUM VISIT ENCOUNTER NOTE Patient name: Kristi Anderson MRN [id number]  Date of birth: September 19, 1990  I connected with patient on 01/13/19 at  3:10 PM EDT by phone (unable to connect to video)  and verified that I am speaking with the correct person using two identifiers. Due to COVID-19 recommendations, pt is not currently in our office.    I discussed the limitations, risks, security and privacy concerns of performing an evaluation and management service by telephone and the availability of in person appointments. I also discussed with the patient that there may be a patient responsible charge related to this service. The patient expressed understanding and agreed to proceed.  Chief Complaint:   Postpartum Care (still bleeding )  History of Present Illness:   Kristi Anderson is a 28 y.o. G40P2002 Caucasian female being evaluated today for a postpartum visit. She is 5 weeks postpartum following a spontaneous vaginal delivery at 39.3 gestational weeks after IOL for A2DM. Anesthesia: epidural. Laceration: 2nd degree. I have fully reviewed the prenatal and intrapartum course. Pregnancy complicated by G4WN. Postpartum course has been uncomplicated. Bleeding thin lochia, concerned b/c it hasn't stopped yet, discussed can be normal up to 6wks, if still bleeding after 6wks let us know. Bowel function is normal. Bladder function is normal.  Patient is not sexually active. Last sexual activity: prior to birth of baby.  Contraception method is got depo in hospital on 9/8.  Last pap 06/07/18.  Results were normal .  No LMP recorded.  Baby's course has been uncomplicated. Baby is feeding by bottle   Edinburgh Postpartum Depression Screening: negative Edinburgh Postnatal Depression Scale - 01/13/19 1527      Edinburgh Postnatal Depression Scale:  In the Past 7 Days   I have been able to laugh and see the funny side of things.  0    I have looked forward with enjoyment to things.   0    I have blamed myself unnecessarily when things went wrong.  0    I have been anxious or worried for no good reason.  0    I have felt scared or panicky for no good reason.  0    Things have been getting on top of me.  0    I have been so unhappy that I have had difficulty sleeping.  0    I have felt sad or miserable.  0    I have been so unhappy that I have been crying.  0    The thought of harming myself has occurred to me.  0    Edinburgh Postnatal Depression Scale Total  0      Review of Systems:   Pertinent items are noted in HPI Denies Abnormal vaginal discharge w/ itching/odor/irritation, headaches, visual changes, shortness of breath, chest pain, abdominal pain, severe nausea/vomiting, or problems with urination or bowel movements. Pertinent History Reviewed:  Reviewed past medical,surgical, obstetrical and family history.  Reviewed problem list, medications and allergies. OB History  Gravida Para Term Preterm AB Living  2 2 2  0 0 2  SAB TAB Ectopic Multiple Live Births  0 0 0 0 2    # Outcome Date GA Lbr Len/2nd Weight Sex Delivery Anes PTL Lv  2 Term 12/08/18 [redacted]w[redacted]d 05:50 / 00:17 7 lb 6.9 oz (3.371 kg) M Vag-Spont EPI  LIV  1 Term 01/11/16 [redacted]w[redacted]d 09:23 / 01:59 6 lb 1 oz (2.75 kg) F Vag-Spont None N LIV     Birth Comments:  wnl     Complications: Preeclampsia   Physical Assessment:  There were no vitals filed for this visit.There is no height or weight on file to calculate BMI.       Physical Examination:  General:  Alert, oriented and cooperative.   Mental Status: Normal mood and affect perceived. Normal judgment and thought content.  Rest of physical exam deferred due to type of encounter       No results found for this or any previous visit (from the past 24 hour(s)).  Assessment & Plan:  1) Postpartum exam 2) 5 wks s/p SVB 3) Bottlefeeding 4) Depression screening 5) Contraception counseling,  got depo 12/10/18 in hospital  6) A2DM in pregnancy> will do 2hr GTT  @ 8wks pp  Meds: No orders of the defined types were placed in this encounter.   I discussed the assessment and treatment plan with the patient. The patient was provided an opportunity to ask questions and all were answered. The patient agreed with the plan and demonstrated an understanding of the instructions.   The patient was advised to call back or seek an in-person evaluation/go to the ED for any concerning postpartum symptoms.  I provided 15 minutes of non-face-to-face time during this encounter.  Follow-up: Return for 3wks for 2hr GTT, then 11-13wks from 9/8 for next depo.   No orders of the defined types were placed in this encounter.   Cheral Marker CNM, [facility] 01/13/2019 3:45 PM

## 2019-02-03 ENCOUNTER — Other Ambulatory Visit: Payer: BC Managed Care – PPO

## 2019-02-03 DIAGNOSIS — Z029 Encounter for administrative examinations, unspecified: Secondary | ICD-10-CM

## 2019-02-05 ENCOUNTER — Encounter: Payer: Self-pay | Admitting: *Deleted

## 2019-02-10 ENCOUNTER — Other Ambulatory Visit: Payer: Self-pay

## 2019-02-10 ENCOUNTER — Other Ambulatory Visit: Payer: BC Managed Care – PPO

## 2019-02-10 DIAGNOSIS — Z8632 Personal history of gestational diabetes: Secondary | ICD-10-CM

## 2019-02-11 ENCOUNTER — Other Ambulatory Visit: Payer: Self-pay | Admitting: Women's Health

## 2019-02-11 DIAGNOSIS — Z8632 Personal history of gestational diabetes: Secondary | ICD-10-CM

## 2019-02-11 LAB — GLUCOSE TOLERANCE, 2 HOURS W/ 1HR
Glucose, 1 hour: 103 mg/dL (ref 65–179)
Glucose, 2 hour: 115 mg/dL (ref 65–152)
Glucose, Fasting: 98 mg/dL — ABNORMAL HIGH (ref 65–91)

## 2019-02-26 ENCOUNTER — Ambulatory Visit: Payer: BC Managed Care – PPO

## 2019-03-03 ENCOUNTER — Ambulatory Visit: Payer: BC Managed Care – PPO

## 2019-03-05 ENCOUNTER — Other Ambulatory Visit: Payer: Self-pay

## 2019-03-05 ENCOUNTER — Encounter: Payer: BC Managed Care – PPO | Admitting: *Deleted

## 2019-03-05 ENCOUNTER — Telehealth: Payer: Self-pay | Admitting: *Deleted

## 2019-03-05 ENCOUNTER — Ambulatory Visit (INDEPENDENT_AMBULATORY_CARE_PROVIDER_SITE_OTHER): Payer: BC Managed Care – PPO | Admitting: *Deleted

## 2019-03-05 DIAGNOSIS — Z3042 Encounter for surveillance of injectable contraceptive: Secondary | ICD-10-CM | POA: Diagnosis not present

## 2019-03-05 DIAGNOSIS — Z308 Encounter for other contraceptive management: Secondary | ICD-10-CM

## 2019-03-05 MED ORDER — MEDROXYPROGESTERONE ACETATE 150 MG/ML IM SUSP
150.0000 mg | Freq: Once | INTRAMUSCULAR | Status: AC
  Administered 2019-03-05: 150 mg via INTRAMUSCULAR

## 2019-03-05 NOTE — Progress Notes (Signed)
   NURSE VISIT- INJECTION  SUBJECTIVE:  Kristi Anderson is a 28 y.o. G3P2002 female here for a Depo Provera for contraception/period management. She is a GYN patient.   OBJECTIVE:  There were no vitals taken for this visit.  Appears well, in no apparent distress  Injection administered in: Left deltoid  No orders of the defined types were placed in this encounter.   ASSESSMENT: GYN patient Depo Provera for contraception/period management PLAN: Follow-up: in 11-13 weeks for next Depo   Kristi Anderson  03/05/2019 2:27 PM

## 2019-03-05 NOTE — Telephone Encounter (Signed)
Pt showed up without her Depo. First shot was given in hospital. I called in Depo Provera 150 mg #1 inject into the skin every 3 months with 3 refills. Thanks!! [facility]

## 2019-05-26 ENCOUNTER — Other Ambulatory Visit: Payer: Self-pay | Admitting: Advanced Practice Midwife

## 2019-05-26 MED ORDER — MEDROXYPROGESTERONE ACETATE 150 MG/ML IM SUSP: 150.0000 mg | INTRAMUSCULAR | 3 refills | Status: DC

## 2019-05-26 NOTE — Progress Notes (Signed)
Depo rx sent 

## 2019-05-28 ENCOUNTER — Ambulatory Visit: Payer: BC Managed Care – PPO

## 2019-05-30 ENCOUNTER — Other Ambulatory Visit: Payer: Self-pay

## 2019-05-30 ENCOUNTER — Ambulatory Visit (INDEPENDENT_AMBULATORY_CARE_PROVIDER_SITE_OTHER): Payer: BC Managed Care – PPO | Admitting: *Deleted

## 2019-05-30 DIAGNOSIS — Z3042 Encounter for surveillance of injectable contraceptive: Secondary | ICD-10-CM | POA: Diagnosis not present

## 2019-05-30 DIAGNOSIS — Z308 Encounter for other contraceptive management: Secondary | ICD-10-CM

## 2019-05-30 MED ORDER — MEDROXYPROGESTERONE ACETATE 150 MG/ML IM SUSP
150.0000 mg | Freq: Once | INTRAMUSCULAR | Status: AC
  Administered 2019-05-30: 150 mg via INTRAMUSCULAR

## 2019-05-30 NOTE — Progress Notes (Signed)
   NURSE VISIT- INJECTION  SUBJECTIVE:  Kristi Anderson is a 29 y.o. G58P2002 female here for a Depo Provera for contraception/period management. She is a GYN patient.   OBJECTIVE:  There were no vitals taken for this visit.  Appears well, in no apparent distress  Injection administered in: Right deltoid  No orders of the defined types were placed in this encounter.   ASSESSMENT: GYN patient Depo Provera for contraception/period management PLAN: Follow-up: in 11-13 weeks for next Depo   Stoney Bang  05/30/2019 10:24 AM

## 2019-08-12 ENCOUNTER — Ambulatory Visit: Payer: BC Managed Care – PPO | Admitting: Adult Health

## 2019-08-13 ENCOUNTER — Other Ambulatory Visit: Payer: Self-pay

## 2019-08-13 ENCOUNTER — Ambulatory Visit
Admission: EM | Admit: 2019-08-13 | Discharge: 2019-08-13 | Disposition: A | Discharge status: Home / Self Care | Payer: BC Managed Care – PPO | Attending: Family Medicine | Admitting: Family Medicine

## 2019-08-13 DIAGNOSIS — Z20822 Contact with and (suspected) exposure to covid-19: Secondary | ICD-10-CM

## 2019-08-13 DIAGNOSIS — J3489 Other specified disorders of nose and nasal sinuses: Secondary | ICD-10-CM | POA: Diagnosis not present

## 2019-08-13 DIAGNOSIS — R000 Tachycardia, unspecified: Secondary | ICD-10-CM

## 2019-08-13 NOTE — ED Triage Notes (Signed)
Pt was exposed to COVID + person and wants COVID test. Pt denies symptoms at this time.

## 2019-08-13 NOTE — Discharge Instructions (Signed)
Your COVID test is pending.  You should self quarantine until the test result is back.    Take Tylenol as needed for fever or discomfort.  Rest and keep yourself hydrated.    Go to the emergency department if you develop shortness of breath, severe diarrhea, high fever not relieved by Tylenol or ibuprofen, or other concerning symptoms.    

## 2019-08-13 NOTE — ED Provider Notes (Signed)
[facility]   [id number] 08/13/19 Arrival Time: 1737   CC: COVID symptoms  SUBJECTIVE: History from: patient.  Kristi Anderson is a 29 y.o. female who presents with gradual onset of rhinorrhea, nasal congestion, PND for the last 2 weeks. Reports sick exposure to Covid x 4 days ago.  Denies sick exposure to flu or strep.  Denies recent travel.  Has tried zyrtec without relief.  There are no aggravating symptoms.  Reports previous symptoms in the past. Denies fever, chills, fatigue, sinus pain, , sore throat, SOB, wheezing, chest pain, nausea, changes in bowel or bladder habits.    ROS: As per HPI.  All other pertinent ROS negative.     Past Medical History:  Diagnosis Date  . Allergy    morphine and latex  . Anxiety   . Gestational diabetes   . Preeclampsia, severe 01/10/2016  . Pregnant 06/01/2015  . PTSD (post-traumatic stress disorder)    had dog attack 11/15 had 4 surgeries on left ear  . PTSD (post-traumatic stress disorder)   . Vitamin D deficiency    Past Surgical History:  Procedure Laterality Date  . 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 EXTERNAL EAR SURGERY     Allergies  Allergen Reactions  . Morphine And Related Nausea Only  . Latex Swelling   No current facility-administered medications on file prior to encounter.   Current Outpatient Medications on File Prior to Encounter  Medication Sig Dispense Refill  . acetaminophen (TYLENOL) 325 MG tablet Take 2 tablets (650 mg total) by mouth every 6 (six) hours as needed (for pain scale < 4). (Patient not taking: Reported on 01/13/2019)    . aspirin EC 81 MG tablet Take 162 mg by mouth daily.     Marland Kitchen ibuprofen (ADVIL) 600 MG tablet Take 1 tablet (600 mg total) by mouth every 8 (eight) hours as needed for mild pain. 30 tablet 0  . medroxyPROGESTERone (DEPO-PROVERA) 150 MG/ML injection Inject 1 mL (150 mg total) into the muscle every 3 (three) months. 1 mL 3  . prenatal vitamin  w/FE, FA (PRENATAL 1 + 1) 27-1 MG TABS tablet Take 1 tablet by mouth daily at 12 noon. 30 each 12  . senna-docusate (SENOKOT-S) 8.6-50 MG tablet Take 2 tablets by mouth daily.     Social History   Socioeconomic History  . Marital status: Married    Spouse name: Advertising copywriter  . Number of children: 1  . Years of education: Not on file  . Highest education level: Associate degree: academic program  Occupational History  . Not on file  Tobacco Use  . Smoking status: Passive Smoke Exposure - Never Smoker  . Smokeless tobacco: Never Used  Substance and Sexual Activity  . Alcohol use: No    Alcohol/week: 0.0 standard drinks  . Drug use: No  . Sexual activity: Not Currently    Partners: Male    Birth control/protection: None, Injection  Other Topics Concern  . Not on file  Social History Narrative   ** Merged History Encounter **       Social Determinants of Health   Financial Resource Strain:   . Difficulty of Paying Living Expenses:   Food Insecurity:   . Worried About Programme researcher, broadcasting/film/video in the Last Year:   . Barista in the Last Year:   Transportation Needs:   . Freight forwarder (Medical):   Marland Kitchen Lack of Transportation (Non-Medical):  Physical Activity:   . Days of Exercise per Week:   . Minutes of Exercise per Session:   Stress:   . Feeling of Stress :   Social Connections:   . Frequency of Communication with Friends and Family:   . Frequency of Social Gatherings with Friends and Family:   . Attends Religious Services:   . Active Member of Clubs or Organizations:   . Attends Archivist Meetings:   Marland Kitchen Marital Status:   Intimate Partner Violence:   . Fear of Current or Ex-Partner:   . Emotionally Abused:   Marland Kitchen Physically Abused:   . Sexually Abused:    Family History  Problem Relation Age of Onset  . Arthritis Mother   . Heart disease Mother   . Clotting disorder Father   . Heart failure Father   . Stroke Maternal Grandmother   . Heart  disease Maternal Grandfather   . Early death Maternal Grandfather   . Arthritis Paternal Grandmother   . Diabetes Paternal Grandfather   . Seizures Sister     OBJECTIVE:  Vitals:   08/13/19 1748  BP: 131/82  Pulse: (!) 105  Resp: 18  Temp: 98.5 F (36.9 C)  TempSrc: Oral  SpO2: 97%  Weight: 152 lb (68.9 kg)  Height: 5\' 3"  (1.6 m)     General appearance: alert; appears fatigued, but nontoxic; speaking in full sentences and tolerating own secretions HEENT: NCAT; Ears: EACs clear, TMs pearly gray; Eyes: PERRL.  EOM grossly intact. Sinuses: nontender; Nose: nares patent without rhinorrhea, Throat: oropharynx clear, tonsils non erythematous or enlarged, uvula midline  Neck: supple without LAD Lungs: unlabored respirations, symmetrical air entry; cough: absent; no respiratory distress; CTAB Heart: tachycardia and regular rhythm.  Radial pulses 2+ symmetrical bilaterally Skin: warm and dry Psychological: alert and cooperative; normal mood and affect  LABS:  No results found for this or any previous visit (from the past 24 hour(s)).   ASSESSMENT & PLAN:  1. Close exposure to COVID-19 virus   2. Tachycardia   3. Rhinorrhea     No orders of the defined types were placed in this encounter.      Patient should remain in quarantine until they have received culture results.  If negative you may resume normal activities (go back to work/school) while practicing hand hygiene, social distance, and mask wearing.  If positive, patient should remain in quarantine for 10 days from symptom onset AND greater than 72 hours after symptoms resolution (absence of fever without the use of fever-reducing medication and improvement in respiratory symptoms), whichever is longer Get plenty of rest and push fluids Use OTC zyrtec for nasal congestion, runny nose, and/or sore throat Use OTC flonase for nasal congestion and runny nose Use medications daily for symptom relief Use OTC medications like  ibuprofen or tylenol as needed fever or pain Call or go to the ED if you have any new or worsening symptoms such as fever, worsening cough, shortness of breath, chest tightness, chest pain, turning blue, changes in mental status, etc...    COVID testing ordered.  It will take between 5-7 days for test results.  Someone will contact you regarding abnormal results.    Reviewed expectations re: course of current medical issues. Questions answered. Outlined signs and symptoms indicating need for more acute intervention. Patient verbalized understanding. After Visit Summary given.         Faustino Congress, NP 08/13/19 1805

## 2019-08-14 LAB — NOVEL CORONAVIRUS, NAA: SARS-CoV-2, NAA: NOT DETECTED

## 2019-08-14 LAB — SARS-COV-2, NAA 2 DAY TAT

## 2019-08-15 ENCOUNTER — Other Ambulatory Visit: Payer: Self-pay

## 2019-08-15 ENCOUNTER — Ambulatory Visit (INDEPENDENT_AMBULATORY_CARE_PROVIDER_SITE_OTHER): Payer: BC Managed Care – PPO | Admitting: Adult Health

## 2019-08-15 ENCOUNTER — Encounter: Payer: Self-pay | Admitting: Adult Health

## 2019-08-15 VITALS — BP 116/71 | HR 88 | Ht 63.0 in | Wt 151.5 lb

## 2019-08-15 DIAGNOSIS — Z30011 Encounter for initial prescription of contraceptive pills: Secondary | ICD-10-CM

## 2019-08-15 MED ORDER — NORETHIN ACE-ETH ESTRAD-FE 1-20 MG-MCG PO TABS: 1.0000 | ORAL_TABLET | Freq: Every day | ORAL | 11 refills | Status: DC

## 2019-08-15 NOTE — Progress Notes (Signed)
  Subjective:     Patient ID: Kristi Anderson, female   DOB: Aug 29, 1990, 29 y.o.   MRN: [id number]  HPI Kristi Anderson is a 29 year old white female, married, 31P2, on depo, in to change to the pill.She has felt tired and hungry with the depo and was seen by [facility] in [place] and had labs, triglycerides were elevated and vitamin D was low, other labs normal she says. Next depo due next week.She was thinking about Dianah Field as her friend is on this. PCP is [place].   Review of Systems Has been tired and hungry with depo Denies any migraines with aura, DVT,stroke or breast cancer   Reviewed past medical,surgical, social and family history. Reviewed medications and allergies.     Objective:   Physical Exam BP 116/71 (BP Location: Left Arm, Patient Position: Sitting, Cuff Size: Normal)   Pulse 88   Ht 5\' 3"  (1.6 m)   Wt 151 lb 8 oz (68.7 kg)   LMP 07/27/2019   Breastfeeding No   BMI 26.84 kg/m   Fall risk is low.  Skin warm and dry.. Lungs: clear to ausculation bilaterally. Cardiovascular: regular rate and rhythm.    Assessment:     1. Encounter for initial prescription of contraceptive pills Discussed to lets try low dose pill first and she agrees  Will Rx Junel 1/20 to start Sunday and use back up for one month     Plan:     Follow up in 3 months

## 2019-08-20 ENCOUNTER — Ambulatory Visit: Payer: BC Managed Care – PPO

## 2019-11-14 ENCOUNTER — Ambulatory Visit: Payer: BC Managed Care – PPO | Admitting: Adult Health

## 2019-11-14 ENCOUNTER — Ambulatory Visit
Admission: EM | Admit: 2019-11-14 | Discharge: 2019-11-14 | Disposition: A | Discharge status: Home / Self Care | Payer: BC Managed Care – PPO | Attending: Emergency Medicine | Admitting: Emergency Medicine

## 2019-11-14 ENCOUNTER — Other Ambulatory Visit: Payer: Self-pay

## 2019-11-14 ENCOUNTER — Encounter: Payer: Self-pay | Admitting: Emergency Medicine

## 2019-11-14 DIAGNOSIS — J069 Acute upper respiratory infection, unspecified: Secondary | ICD-10-CM

## 2019-11-14 DIAGNOSIS — Z1152 Encounter for screening for COVID-19: Secondary | ICD-10-CM

## 2019-11-14 MED ORDER — CETIRIZINE HCL 10 MG PO TABS: 10.0000 mg | ORAL_TABLET | Freq: Every day | ORAL | 0 refills | Status: DC

## 2019-11-14 MED ORDER — PREDNISONE 10 MG PO TABS: 20.0000 mg | ORAL_TABLET | Freq: Every day | ORAL | 0 refills | Status: DC

## 2019-11-14 MED ORDER — FLUTICASONE PROPIONATE 50 MCG/ACT NA SUSP: 1.0000 | Freq: Every day | NASAL | 0 refills | Status: DC

## 2019-11-14 MED ORDER — BENZONATATE 100 MG PO CAPS: 100.0000 mg | ORAL_CAPSULE | Freq: Three times a day (TID) | ORAL | 0 refills | Status: DC

## 2019-11-14 NOTE — ED Provider Notes (Signed)
[facility]   [id number] 11/14/19 Arrival Time: 1458   CC: COVID symptoms  SUBJECTIVE: History from: patient.  Kristi Anderson is a 29 y.o. female who presented to the urgent care for complaint of cough, nasal congestion, ear pressure and sore throat for the past 7 days.  Denies sick exposure to COVID, flu or strep.  Denies recent travel.  Has tried OTC medication without relief.  Denies aggravating factors.  Denies previous symptoms in the past.   Denies fever, chills, fatigue, sinus pain, rhinorrhea,  SOB, wheezing, chest pain, nausea, changes in bowel or bladder habits.     ROS: As per HPI.  All other pertinent ROS negative.     Past Medical History:  Diagnosis Date  . Allergy    morphine and latex  . Anxiety   . Gestational diabetes   . Preeclampsia, severe 01/10/2016  . Pregnant 06/01/2015  . PTSD (post-traumatic stress disorder)    had dog attack 11/15 had 4 surgeries on left ear  . PTSD (post-traumatic stress disorder)   . Vitamin D deficiency    Past Surgical History:  Procedure Laterality Date  . 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 EXTERNAL EAR SURGERY     Allergies  Allergen Reactions  . Morphine And Related Nausea Only  . Latex Swelling   No current facility-administered medications on file prior to encounter.   Current Outpatient Medications on File Prior to Encounter  Medication Sig Dispense Refill  . acetaminophen (TYLENOL) 325 MG tablet Take 2 tablets (650 mg total) by mouth every 6 (six) hours as needed (for pain scale < 4).    . Cholecalciferol (VITAMIN D3 PO) Take by mouth daily.    . norethindrone-ethinyl estradiol (LOESTRIN FE) 1-20 MG-MCG tablet Take 1 tablet by mouth daily. 1 Package 11   Social History   Socioeconomic History  . Marital status: Married    Spouse name: Advertising copywriter  . Number of children: 1  . Years of education: Not on file  . Highest education level: Associate degree: academic  program  Occupational History  . Not on file  Tobacco Use  . Smoking status: Passive Smoke Exposure - Never Smoker  . Smokeless tobacco: Never Used  Vaping Use  . Vaping Use: Never used  Substance and Sexual Activity  . Alcohol use: No    Alcohol/week: 0.0 standard drinks  . Drug use: No  . Sexual activity: Yes    Partners: Male    Birth control/protection: Pill, Condom  Other Topics Concern  . Not on file  Social History Narrative   ** Merged History Encounter **       Social Determinants of Health   Financial Resource Strain:   . Difficulty of Paying Living Expenses:   Food Insecurity:   . Worried About Programme researcher, broadcasting/film/video in the Last Year:   . Barista in the Last Year:   Transportation Needs:   . Freight forwarder (Medical):   Marland Kitchen Lack of Transportation (Non-Medical):   Physical Activity:   . Days of Exercise per Week:   . Minutes of Exercise per Session:   Stress:   . Feeling of Stress :   Social Connections:   . Frequency of Communication with Friends and Family:   . Frequency of Social Gatherings with Friends and Family:   . Attends Religious Services:   . Active Member of Clubs or Organizations:   . Attends Club  or Organization Meetings:   Marland Kitchen Marital Status:   Intimate Partner Violence:   . Fear of Current or Ex-Partner:   . Emotionally Abused:   Marland Kitchen Physically Abused:   . Sexually Abused:    Family History  Problem Relation Age of Onset  . Arthritis Mother   . Heart disease Mother   . Clotting disorder Father   . Heart failure Father   . Stroke Maternal Grandmother   . Heart disease Maternal Grandfather   . Early death Maternal Grandfather   . Arthritis Paternal Grandmother   . Diabetes Paternal Grandfather   . Seizures Sister     OBJECTIVE:  Vitals:   11/14/19 1513  BP: 129/89  Pulse: 90  Resp: 16  Temp: 98.8 F (37.1 C)  TempSrc: Oral  SpO2: 97%     General appearance: alert; appears fatigued, but nontoxic; speaking in full  sentences and tolerating own secretions HEENT: NCAT; Ears: EACs clear, TMs pearly gray; Eyes: PERRL.  EOM grossly intact. Sinuses: nontender; Nose: nares patent without rhinorrhea, Throat: oropharynx clear, tonsils non erythematous or enlarged, uvula midline  Neck: supple without LAD Lungs: unlabored respirations, symmetrical air entry; cough: moderate; no respiratory distress; CTAB Heart: regular rate and rhythm.  Radial pulses 2+ symmetrical bilaterally Skin: warm and dry Psychological: alert and cooperative; normal mood and affect  LABS:  No results found for this or any previous visit (from the past 24 hour(s)).   ASSESSMENT & PLAN:  1. URI with cough and congestion   2. Encounter for screening for COVID-19     Meds ordered this encounter  Medications  . benzonatate (TESSALON) 100 MG capsule    Sig: Take 1 capsule (100 mg total) by mouth every 8 (eight) hours.    Dispense:  30 capsule    Refill:  0  . fluticasone (FLONASE) 50 MCG/ACT nasal spray    Sig: Place 1 spray into both nostrils daily for 14 days.    Dispense:  16 g    Refill:  0  . cetirizine (ZYRTEC ALLERGY) 10 MG tablet    Sig: Take 1 tablet (10 mg total) by mouth daily.    Dispense:  30 tablet    Refill:  0  . predniSONE (DELTASONE) 10 MG tablet    Sig: Take 2 tablets (20 mg total) by mouth daily.    Dispense:  15 tablet    Refill:  0    Discharge Instructions   COVID testing ordered.  It will take between 2-7 days for test results.  Someone will contact you regarding abnormal results.    In the meantime: You should remain isolated in your home for 10 days from symptom onset AND greater than 24 hours after symptoms resolution (absence of fever without the use of fever-reducing medication and improvement in respiratory symptoms), whichever is longer Get plenty of rest and push fluids Tessalon Perles prescribed for cough Zyrtec for nasal congestion, runny nose, and/or sore throat Flonase for nasal  congestion and runny nose Low-dose prednisone was prescribed Use medications daily for symptom relief Use OTC medications like ibuprofen or tylenol as needed fever or pain Call or go to the ED if you have any new or worsening symptoms such as fever, worsening cough, shortness of breath, chest tightness, chest pain, turning blue, changes in mental status, etc...   Reviewed expectations re: course of current medical issues. Questions answered. Outlined signs and symptoms indicating need for more acute intervention. Patient verbalized understanding. After Visit Summary given.  Note: This document was prepared using Dragon voice recognition software and may include unintentional dictation errors.    Durward Parcel, FNP 11/14/19 [phone]

## 2019-11-14 NOTE — ED Triage Notes (Signed)
Cough, nasal congestion, ears feel stopped up and sore throat x 7 days.

## 2019-11-14 NOTE — Discharge Instructions (Addendum)
COVID testing ordered.  It will take between 2-7 days for test results.  Someone will contact you regarding abnormal results.    In the meantime: You should remain isolated in your home for 10 days from symptom onset AND greater than 24 hours after symptoms resolution (absence of fever without the use of fever-reducing medication and improvement in respiratory symptoms), whichever is longer Get plenty of rest and push fluids Tessalon Perles prescribed for cough Zyrtec for nasal congestion, runny nose, and/or sore throat Flonase for nasal congestion and runny nose Low-dose prednisone was prescribed Use medications daily for symptom relief Use OTC medications like ibuprofen or tylenol as needed fever or pain Call or go to the ED if you have any new or worsening symptoms such as fever, worsening cough, shortness of breath, chest tightness, chest pain, turning blue, changes in mental status, etc...  

## 2019-11-15 LAB — SARS-COV-2, NAA 2 DAY TAT

## 2019-11-15 LAB — NOVEL CORONAVIRUS, NAA: SARS-CoV-2, NAA: NOT DETECTED

## 2020-03-02 DIAGNOSIS — Z0000 Encounter for general adult medical examination without abnormal findings: Secondary | ICD-10-CM | POA: Diagnosis not present

## 2020-03-02 DIAGNOSIS — R519 Headache, unspecified: Secondary | ICD-10-CM | POA: Diagnosis not present

## 2020-03-02 DIAGNOSIS — Z1389 Encounter for screening for other disorder: Secondary | ICD-10-CM | POA: Diagnosis not present

## 2020-03-02 DIAGNOSIS — Z0001 Encounter for general adult medical examination with abnormal findings: Secondary | ICD-10-CM | POA: Diagnosis not present

## 2020-04-06 ENCOUNTER — Telehealth: Payer: Self-pay

## 2020-04-06 NOTE — Telephone Encounter (Signed)
Pt has appt. Tomorrow for possible UTI and is stating that her kidneys are hurting wanting to know if there is anything she can do OTC until she is seen

## 2020-04-06 NOTE — Telephone Encounter (Signed)
I called patient back and offered her an appointment for today. States that she is unable to come. Recommended pushing fluids and if she develops a fever or worsening symptoms to go to urgent care tonight. No other questions at this time.

## 2020-04-07 ENCOUNTER — Other Ambulatory Visit: Payer: BC Managed Care – PPO

## 2020-04-07 ENCOUNTER — Other Ambulatory Visit: Payer: Self-pay

## 2020-04-07 ENCOUNTER — Other Ambulatory Visit (INDEPENDENT_AMBULATORY_CARE_PROVIDER_SITE_OTHER): Payer: BC Managed Care – PPO

## 2020-04-07 DIAGNOSIS — R399 Unspecified symptoms and signs involving the genitourinary system: Secondary | ICD-10-CM

## 2020-04-07 LAB — POCT URINALYSIS DIPSTICK OB
Glucose, UA: NEGATIVE
Ketones, UA: NEGATIVE
Leukocytes, UA: NEGATIVE
Nitrite, UA: NEGATIVE
POC,PROTEIN,UA: NEGATIVE

## 2020-04-07 MED ORDER — SULFAMETHOXAZOLE-TRIMETHOPRIM 800-160 MG PO TABS: 1.0000 | ORAL_TABLET | Freq: Two times a day (BID) | ORAL | 0 refills | Status: DC

## 2020-04-07 MED ORDER — PHENAZOPYRIDINE HCL 200 MG PO TABS: 200.0000 mg | ORAL_TABLET | Freq: Three times a day (TID) | ORAL | 0 refills | Status: DC | PRN

## 2020-04-07 NOTE — Addendum Note (Signed)
Addended by: Cyril Mourning A on: 04/07/2020 12:08 PM   Modules accepted: Orders

## 2020-04-07 NOTE — Progress Notes (Signed)
   NURSE VISIT- UTI SYMPTOMS   SUBJECTIVE:  Kristi Anderson is a 30 y.o. G68P2002 female here for UTI symptoms. She is a GYN patient. She reports flank pain bilaterally and burning.  OBJECTIVE:  There were no vitals taken for this visit.  Appears well, in no apparent distress  No results found for this or any previous visit (from the past 24 hour(s)).  ASSESSMENT: GYN patient with UTI symptoms and negative nitrites  PLAN: Discussed with Kristi Anderson, AGNP   Rx sent by provider today: Yes Urine culture sent Call or return to clinic prn if these symptoms worsen or fail to improve as anticipated. Follow-up: as needed   Kristi Anderson  04/07/2020 11:36 AM

## 2020-04-07 NOTE — Progress Notes (Signed)
Chart reviewed for nurse visit. Agree with plan of care. Will rx septra ds and pyridium  Adline Potter, NP 04/07/2020 12:07 PM

## 2020-04-08 LAB — URINALYSIS, ROUTINE W REFLEX MICROSCOPIC
Bilirubin, UA: NEGATIVE
Glucose, UA: NEGATIVE
Ketones, UA: NEGATIVE
Nitrite, UA: NEGATIVE
Protein,UA: NEGATIVE
RBC, UA: NEGATIVE
Specific Gravity, UA: 1.023 (ref 1.005–1.030)
Urobilinogen, Ur: 0.2 mg/dL (ref 0.2–1.0)
pH, UA: 5 (ref 5.0–7.5)

## 2020-04-08 LAB — MICROSCOPIC EXAMINATION
Bacteria, UA: NONE SEEN
Casts: NONE SEEN /lpf
WBC, UA: NONE SEEN /hpf (ref 0–5)

## 2020-04-09 LAB — URINE CULTURE: Organism ID, Bacteria: NO GROWTH

## 2020-05-31 ENCOUNTER — Telehealth: Payer: BC Managed Care – PPO | Admitting: Physician Assistant

## 2020-05-31 DIAGNOSIS — B9789 Other viral agents as the cause of diseases classified elsewhere: Secondary | ICD-10-CM

## 2020-05-31 DIAGNOSIS — J019 Acute sinusitis, unspecified: Secondary | ICD-10-CM

## 2020-05-31 MED ORDER — IPRATROPIUM BROMIDE 0.03 % NA SOLN: 2.0000 | Freq: Two times a day (BID) | NASAL | 0 refills | Status: DC

## 2020-05-31 NOTE — Progress Notes (Signed)
I have spent 5 minutes in review of e-visit questionnaire, review and updating patient chart, medical decision making and response to patient.   William Cody Martin, PA-C    

## 2020-05-31 NOTE — Progress Notes (Signed)

## 2020-06-17 ENCOUNTER — Encounter (INDEPENDENT_AMBULATORY_CARE_PROVIDER_SITE_OTHER): Payer: Self-pay | Admitting: *Deleted

## 2020-07-06 ENCOUNTER — Ambulatory Visit (INDEPENDENT_AMBULATORY_CARE_PROVIDER_SITE_OTHER): Payer: BC Managed Care – PPO | Admitting: Gastroenterology

## 2020-07-21 NOTE — Telephone Encounter (Signed)
Patient called and wants to know if she can come tomorrow at 11am for her nurse visit instead of Friday.

## 2020-07-22 ENCOUNTER — Other Ambulatory Visit: Payer: Self-pay

## 2020-07-22 ENCOUNTER — Other Ambulatory Visit (INDEPENDENT_AMBULATORY_CARE_PROVIDER_SITE_OTHER): Payer: BC Managed Care – PPO | Admitting: *Deleted

## 2020-07-22 DIAGNOSIS — N3941 Urge incontinence: Secondary | ICD-10-CM

## 2020-07-22 DIAGNOSIS — R300 Dysuria: Secondary | ICD-10-CM

## 2020-07-22 LAB — POCT URINALYSIS DIPSTICK OB
Glucose, UA: NEGATIVE
Nitrite, UA: NEGATIVE
POC,PROTEIN,UA: NEGATIVE

## 2020-07-22 MED ORDER — PHENAZOPYRIDINE HCL 200 MG PO TABS: 200.0000 mg | ORAL_TABLET | Freq: Three times a day (TID) | ORAL | 0 refills | Status: DC | PRN

## 2020-07-22 MED ORDER — SULFAMETHOXAZOLE-TRIMETHOPRIM 800-160 MG PO TABS: 1.0000 | ORAL_TABLET | Freq: Two times a day (BID) | ORAL | 0 refills | Status: DC

## 2020-07-22 NOTE — Progress Notes (Signed)
Chart reviewed for nurse visit. Agree with plan of care. Will rx septra ds and pyridium Adline Potter, NP 07/22/2020 12:07 PM

## 2020-07-22 NOTE — Addendum Note (Signed)
Addended by: Cyril Mourning A on: 07/22/2020 12:07 PM   Modules accepted: Orders

## 2020-07-22 NOTE — Progress Notes (Signed)
   NURSE VISIT- UTI SYMPTOMS   SUBJECTIVE:  Kristi Anderson is a 30 y.o. G58P2002 female here for UTI symptoms. She is a GYN patient. She reports dysuria and urinary urgency.  OBJECTIVE:  There were no vitals taken for this visit.  Appears well, in no apparent distress  Results for orders placed or performed in visit on 07/22/20 (from the past 24 hour(s))  POC Urinalysis Dipstick OB   Collection Time: 07/22/20 11:04 AM  Result Value Ref Range   Color, UA     Clarity, UA     Glucose, UA Negative Negative   Bilirubin, UA     Ketones, UA small    Spec Grav, UA     Blood, UA mod    pH, UA     POC,PROTEIN,UA Negative Negative, Trace, Small (1+), Moderate (2+), Large (3+), 4+   Urobilinogen, UA     Nitrite, UA neg    Leukocytes, UA Trace (A) Negative   Appearance     Odor      ASSESSMENT: GYN patient with UTI symptoms and negative nitrites  PLAN: Discussed with Cyril Mourning, AGNP   Rx sent by provider today: Yes Urine culture sent Call or return to clinic prn if these symptoms worsen or fail to improve as anticipated. Follow-up: as scheduled   Annamarie Dawley  07/22/2020 11:10 AM

## 2020-07-23 ENCOUNTER — Other Ambulatory Visit: Payer: BC Managed Care – PPO

## 2020-07-23 LAB — URINALYSIS, ROUTINE W REFLEX MICROSCOPIC
Bilirubin, UA: NEGATIVE
Glucose, UA: NEGATIVE
Ketones, UA: NEGATIVE
Leukocytes,UA: NEGATIVE
Nitrite, UA: NEGATIVE
Protein,UA: NEGATIVE
Specific Gravity, UA: 1.02 (ref 1.005–1.030)
Urobilinogen, Ur: 0.2 mg/dL (ref 0.2–1.0)
pH, UA: 5.5 (ref 5.0–7.5)

## 2020-07-23 LAB — MICROSCOPIC EXAMINATION
Bacteria, UA: NONE SEEN
Casts: NONE SEEN /lpf
RBC, Urine: NONE SEEN /hpf (ref 0–2)

## 2020-07-25 LAB — URINE CULTURE

## 2020-08-25 ENCOUNTER — Telehealth: Payer: BC Managed Care – PPO | Admitting: Physician Assistant

## 2020-08-25 DIAGNOSIS — J029 Acute pharyngitis, unspecified: Secondary | ICD-10-CM

## 2020-08-25 NOTE — Progress Notes (Signed)
E-Visit for Sore Throat  We are sorry that you are not feeling well.  Here is how we plan to help!  Your symptoms indicate a likely viral infection (Pharyngitis).   Pharyngitis is inflammation in the back of the throat which can cause a sore throat, scratchiness and sometimes difficulty swallowing.   Pharyngitis is typically caused by a respiratory virus and will just run its course.  Please keep in mind that your symptoms could last up to 10 days.  For throat pain, we recommend over the counter oral pain relief medications such as acetaminophen or aspirin, or anti-inflammatory medications such as ibuprofen or naproxen sodium.  Topical treatments such as oral throat lozenges or sprays may be used as needed.  Avoid close contact with loved ones, especially the very young and elderly.  Remember to wash your hands thoroughly throughout the day as this is the number one way to prevent the spread of infection and wipe down door knobs and counters with disinfectant.  After careful review of your answers, I would not recommend and antibiotic for your condition.  Antibiotics should not be used to treat conditions that we suspect are caused by viruses like the virus that causes the common cold or flu. However, some people can have Strep with atypical symptoms. You may need formal testing in clinic or office to confirm if your symptoms continue or worsen.  Providers prescribe antibiotics to treat infections caused by bacteria. Antibiotics are very powerful in treating bacterial infections when they are used properly.  To maintain their effectiveness, they should be used only when necessary.  Overuse of antibiotics has resulted in the development of super bugs that are resistant to treatment!    Home Care:  Only take medications as instructed by your medical team.  Do not drink alcohol while taking these medications.  A steam or ultrasonic humidifier can help congestion.  You can place a towel over your head  and breathe in the steam from hot water coming from a faucet.  Avoid close contacts especially the very young and the elderly.  Cover your mouth when you cough or sneeze.  Always remember to wash your hands.  Get Help Right Away If:  You develop worsening fever or throat pain.  You develop a severe head ache or visual changes.  Your symptoms persist after you have completed your treatment plan.  Make sure you  Understand these instructions.  Will watch your condition.  Will get help right away if you are not doing well or get worse.  Your e-visit answers were reviewed by a board certified advanced clinical practitioner to complete your personal care plan.  Depending on the condition, your plan could have included both over the counter or prescription medications.  If there is a problem please reply  once you have received a response from your provider.  Your safety is important to us.  If you have drug allergies check your prescription carefully.    You can use MyChart to ask questions about todays visit, request a non-urgent call back, or ask for a work or school excuse for 24 hours related to this e-Visit. If it has been greater than 24 hours you will need to follow up with your provider, or enter a new e-Visit to address those concerns.  You will get an e-mail in the next two days asking about your experience.  I hope that your e-visit has been valuable and will speed your recovery. Thank you for using e-visits.    

## 2020-08-25 NOTE — Progress Notes (Signed)
I have spent 5 minutes in review of e-visit questionnaire, review and updating patient chart, medical decision making and response to patient.   Mushka Laconte Cody Tarren Sabree, PA-C    

## 2020-09-20 ENCOUNTER — Other Ambulatory Visit (INDEPENDENT_AMBULATORY_CARE_PROVIDER_SITE_OTHER): Payer: Self-pay

## 2020-09-20 ENCOUNTER — Encounter (INDEPENDENT_AMBULATORY_CARE_PROVIDER_SITE_OTHER): Payer: Self-pay

## 2020-09-20 ENCOUNTER — Ambulatory Visit (INDEPENDENT_AMBULATORY_CARE_PROVIDER_SITE_OTHER): Payer: BC Managed Care – PPO | Admitting: Gastroenterology

## 2020-09-20 ENCOUNTER — Encounter (INDEPENDENT_AMBULATORY_CARE_PROVIDER_SITE_OTHER): Payer: Self-pay | Admitting: Gastroenterology

## 2020-09-20 ENCOUNTER — Other Ambulatory Visit: Payer: Self-pay

## 2020-09-20 DIAGNOSIS — R195 Other fecal abnormalities: Secondary | ICD-10-CM | POA: Insufficient documentation

## 2020-09-20 DIAGNOSIS — Z01812 Encounter for preprocedural laboratory examination: Secondary | ICD-10-CM

## 2020-09-20 DIAGNOSIS — K625 Hemorrhage of anus and rectum: Secondary | ICD-10-CM | POA: Diagnosis not present

## 2020-09-20 DIAGNOSIS — K5900 Constipation, unspecified: Secondary | ICD-10-CM | POA: Insufficient documentation

## 2020-09-20 NOTE — Progress Notes (Signed)
[name], M.D. Gastroenterology & Hepatology [facility] [address] [place], [place] [address] Primary Care Physician: Assunta Found, MD [address] [place] [address]  Referring MD: PCP  Chief Complaint: Mucus in the stool, rectal bleeding and constipation  History of Present Illness: Kristi Anderson is a 30 y.o. female with past medical history of PTSD anxiety, who presents for evaluation of mucus in stool, rectal bleeding and constipation.  Patient reports that for the last 2 months she has presented new onset of mucusy stools, up to 4 times per day. She states that the stool is usually clear or yellowish in color. States most of the time, the feces come in a small amount. She reports she feels never having a large bowel movement, feels it is always "an incomplete BM". She has noticed 3 times the stool had some fresh blood and one time this was present when she wiped. She feels constantly bloated and with diffuse pressure but no presence of abdominal pain.  The patient denies having any nausea, vomiting, fever, chills, melena, hematemesis, abdominal distention, abdominal pain, jaundice, pruritus. Lost 20 lb on purpose in the last year.  Has only tried stool softeners if she feels too bloated. Adds fiber in her coffee.  No EIM.  Most recent available labs from 06/15/2020 showed a CBC with white blood cell count 7.0, hemoglobin 14.6, platelets 231, CMP showed normal creatinine of 0.7, BUN 7, ALT 15, AST 20, alkaline phosphatase 47, total bilirubin 0.3, albumin 4.6, normal electrolytes.  Last JJK:KXFGH Last Colonoscopy:never  FHx: neg for any gastrointestinal/liver disease, grandmother colon cancer unknown cancer possibly when she was 66, sister IBS Social: neg smoking, alcohol or illicit drug use  Past Medical History: Past Medical History:  Diagnosis Date   Allergy    morphine and latex   Anxiety     Gestational diabetes    Preeclampsia, severe 01/10/2016   Pregnant 06/01/2015   PTSD (post-traumatic stress disorder)    had dog attack 11/15 had 4 surgeries on left ear   PTSD (post-traumatic stress disorder)    Vitamin D deficiency     Past Surgical History: Past Surgical History:  Procedure Laterality Date   COSMETIC SURGERY     reconstructive surgery after attack by dog   EXTERNAL EAR SURGERY      Family History: Family History  Problem Relation Age of Onset   Arthritis Mother    Heart disease Mother    Clotting disorder Father    Heart failure Father    Stroke Maternal Grandmother    Heart disease Maternal Grandfather    Early death Maternal Grandfather    Arthritis Paternal Grandmother    Diabetes Paternal Grandfather    Seizures Sister     Social History: Social History   Tobacco Use  Smoking Status Passive Smoke Exposure - Never Smoker  Smokeless Tobacco Never   Social History   Substance and Sexual Activity  Alcohol Use No   Alcohol/week: 0.0 standard drinks   Social History   Substance and Sexual Activity  Drug Use No    Allergies: Allergies  Allergen Reactions   Morphine And Related Nausea Only   Latex Swelling    Medications: Current Outpatient Medications  Medication Sig Dispense Refill   Cholecalciferol (VITAMIN D3 PO) Take by mouth daily.     phenazopyridine (PYRIDIUM) 200 MG tablet Take 1 tablet (200 mg total) by mouth 3 (three) times daily as needed for pain. 10 tablet 0  sulfamethoxazole-trimethoprim (BACTRIM DS) 800-160 MG tablet Take 1 tablet by mouth 2 (two) times daily. Take 1 bid 14 tablet 0   No current facility-administered medications for this visit.    Review of Systems: GENERAL: negative for malaise, night sweats HEENT: No changes in hearing or vision, no nose bleeds or other nasal problems. NECK: Negative for lumps, goiter, pain and significant neck swelling RESPIRATORY: Negative for cough, wheezing CARDIOVASCULAR:  Negative for chest pain, leg swelling, palpitations, orthopnea GI: SEE HPI MUSCULOSKELETAL: Negative for joint pain or swelling, back pain, and muscle pain. SKIN: Negative for lesions, rash PSYCH: Negative for sleep disturbance, mood disorder and recent psychosocial stressors. HEMATOLOGY Negative for prolonged bleeding, bruising easily, and swollen nodes. ENDOCRINE: Negative for cold or heat intolerance, polyuria, polydipsia and goiter. NEURO: negative for tremor, gait imbalance, syncope and seizures. The remainder of the review of systems is noncontributory.   Physical Exam: BP 107/71 (BP Location: Right Arm, Patient Position: Sitting, Cuff Size: Small)   Pulse 84   Temp 99.1 F (37.3 C) (Oral)   Ht 5\' 3"  (1.6 m)   Wt 131 lb (59.4 kg)   BMI 23.21 kg/m  GENERAL: The patient is AO x3, in no acute distress. HEENT: Head is normocephalic and atraumatic. EOMI are intact. Mouth is well hydrated and without lesions. NECK: Supple. No masses LUNGS: Clear to auscultation. No presence of rhonchi/wheezing/rales. Adequate chest expansion HEART: RRR, normal s1 and s2. ABDOMEN: Soft, nontender, no guarding, no peritoneal signs, and nondistended. BS +. No masses. EXTREMITIES: Without any cyanosis, clubbing, rash, lesions or edema. NEUROLOGIC: AOx3, no focal motor deficit. SKIN: no jaundice, no rashes   Imaging/Labs: as above  I personally reviewed and interpreted the available labs, imaging and endoscopic files.  Impression and Plan: Kristi Anderson is a 30 y.o. female with past medical history of PTSD anxiety, who presents for evaluation of mucus in stool, rectal bleeding and constipation.  The patient has presented new onset of symptoms of unclear etiology.  I explained to the patient that her symptoms could be related to overflow in the setting of significant constipation.  She would benefit from taking MiraLAX every day and uptitrate as needed.  This will also help improve her bloating  episodes.  However, as she has presented bleeding episodes of unclear etiology, will need to proceed with a colonoscopy.  - Schedule colonoscopy - Start taking Miralax 1 capful every day for one week. If bowel movements do not improve, increase to 1 capful every 12 hours. If after two weeks there is no improvement, increase to 1 capful every 8 hours  All questions were answered.      32, MD Gastroenterology and Hepatology [facility]

## 2020-09-20 NOTE — H&P (View-Only) (Signed)
[name], M.D. Gastroenterology & Hepatology [facility] [address] [place], [place] [address] Primary Care Physician: Golding, John, MD [address] [place] [address]  Referring MD: PCP  Chief Complaint: Mucus in the stool, rectal bleeding and constipation  History of Present Illness: Kristi Anderson is a 30 y.o. female with past medical history of PTSD anxiety, who presents for evaluation of mucus in stool, rectal bleeding and constipation.  Patient reports that for the last 2 months she has presented new onset of mucusy stools, up to 4 times per day. She states that the stool is usually clear or yellowish in color. States most of the time, the feces come in a small amount. She reports she feels never having a large bowel movement, feels it is always "an incomplete BM". She has noticed 3 times the stool had some fresh blood and one time this was present when she wiped. She feels constantly bloated and with diffuse pressure but no presence of abdominal pain.  The patient denies having any nausea, vomiting, fever, chills, melena, hematemesis, abdominal distention, abdominal pain, jaundice, pruritus. Lost 20 lb on purpose in the last year.  Has only tried stool softeners if she feels too bloated. Adds fiber in her coffee.  No EIM.  Most recent available labs from 06/15/2020 showed a CBC with white blood cell count 7.0, hemoglobin 14.6, platelets 231, CMP showed normal creatinine of 0.7, BUN 7, ALT 15, AST 20, alkaline phosphatase 47, total bilirubin 0.3, albumin 4.6, normal electrolytes.  Last EGD:never Last Colonoscopy:never  FHx: neg for any gastrointestinal/liver disease, grandmother colon cancer unknown cancer possibly when she was 48, sister IBS Social: neg smoking, alcohol or illicit drug use  Past Medical History: Past Medical History:  Diagnosis Date   Allergy    morphine and latex   Anxiety     Gestational diabetes    Preeclampsia, severe 01/10/2016   Pregnant 06/01/2015   PTSD (post-traumatic stress disorder)    had dog attack 11/15 had 4 surgeries on left ear   PTSD (post-traumatic stress disorder)    Vitamin D deficiency     Past Surgical History: Past Surgical History:  Procedure Laterality Date   COSMETIC SURGERY     reconstructive surgery after attack by dog   EXTERNAL EAR SURGERY      Family History: Family History  Problem Relation Age of Onset   Arthritis Mother    Heart disease Mother    Clotting disorder Father    Heart failure Father    Stroke Maternal Grandmother    Heart disease Maternal Grandfather    Early death Maternal Grandfather    Arthritis Paternal Grandmother    Diabetes Paternal Grandfather    Seizures Sister     Social History: Social History   Tobacco Use  Smoking Status Passive Smoke Exposure - Never Smoker  Smokeless Tobacco Never   Social History   Substance and Sexual Activity  Alcohol Use No   Alcohol/week: 0.0 standard drinks   Social History   Substance and Sexual Activity  Drug Use No    Allergies: Allergies  Allergen Reactions   Morphine And Related Nausea Only   Latex Swelling    Medications: Current Outpatient Medications  Medication Sig Dispense Refill   Cholecalciferol (VITAMIN D3 PO) Take by mouth daily.     phenazopyridine (PYRIDIUM) 200 MG tablet Take 1 tablet (200 mg total) by mouth 3 (three) times daily as needed for pain. 10 tablet 0     sulfamethoxazole-trimethoprim (BACTRIM DS) 800-160 MG tablet Take 1 tablet by mouth 2 (two) times daily. Take 1 bid 14 tablet 0   No current facility-administered medications for this visit.    Review of Systems: GENERAL: negative for malaise, night sweats HEENT: No changes in hearing or vision, no nose bleeds or other nasal problems. NECK: Negative for lumps, goiter, pain and significant neck swelling RESPIRATORY: Negative for cough, wheezing CARDIOVASCULAR:  Negative for chest pain, leg swelling, palpitations, orthopnea GI: SEE HPI MUSCULOSKELETAL: Negative for joint pain or swelling, back pain, and muscle pain. SKIN: Negative for lesions, rash PSYCH: Negative for sleep disturbance, mood disorder and recent psychosocial stressors. HEMATOLOGY Negative for prolonged bleeding, bruising easily, and swollen nodes. ENDOCRINE: Negative for cold or heat intolerance, polyuria, polydipsia and goiter. NEURO: negative for tremor, gait imbalance, syncope and seizures. The remainder of the review of systems is noncontributory.   Physical Exam: BP 107/71 (BP Location: Right Arm, Patient Position: Sitting, Cuff Size: Small)   Pulse 84   Temp 99.1 F (37.3 C) (Oral)   Ht 5\' 3"  (1.6 m)   Wt 131 lb (59.4 kg)   BMI 23.21 kg/m  GENERAL: The patient is AO x3, in no acute distress. HEENT: Head is normocephalic and atraumatic. EOMI are intact. Mouth is well hydrated and without lesions. NECK: Supple. No masses LUNGS: Clear to auscultation. No presence of rhonchi/wheezing/rales. Adequate chest expansion HEART: RRR, normal s1 and s2. ABDOMEN: Soft, nontender, no guarding, no peritoneal signs, and nondistended. BS +. No masses. EXTREMITIES: Without any cyanosis, clubbing, rash, lesions or edema. NEUROLOGIC: AOx3, no focal motor deficit. SKIN: no jaundice, no rashes   Imaging/Labs: as above  I personally reviewed and interpreted the available labs, imaging and endoscopic files.  Impression and Plan: Kristi Anderson is a 30 y.o. female with past medical history of PTSD anxiety, who presents for evaluation of mucus in stool, rectal bleeding and constipation.  The patient has presented new onset of symptoms of unclear etiology.  I explained to the patient that her symptoms could be related to overflow in the setting of significant constipation.  She would benefit from taking MiraLAX every day and uptitrate as needed.  This will also help improve her bloating  episodes.  However, as she has presented bleeding episodes of unclear etiology, will need to proceed with a colonoscopy.  - Schedule colonoscopy - Start taking Miralax 1 capful every day for one week. If bowel movements do not improve, increase to 1 capful every 12 hours. If after two weeks there is no improvement, increase to 1 capful every 8 hours  All questions were answered.      32, MD Gastroenterology and Hepatology [facility]

## 2020-09-20 NOTE — Patient Instructions (Signed)
Schedule colonoscopy Start taking Miralax 1 capful every day for one week. If bowel movements do not improve, increase to 1 capful every 12 hours. If after two weeks there is no improvement, increase to 1 capful every 8 hours

## 2020-09-27 ENCOUNTER — Encounter (INDEPENDENT_AMBULATORY_CARE_PROVIDER_SITE_OTHER): Payer: Self-pay

## 2020-10-11 ENCOUNTER — Telehealth (INDEPENDENT_AMBULATORY_CARE_PROVIDER_SITE_OTHER): Payer: Self-pay

## 2020-10-11 MED ORDER — PEG 3350-KCL-NA BICARB-NACL 420 G PO SOLR: 4000.0000 mL | ORAL | 0 refills | Status: DC

## 2020-10-11 NOTE — Telephone Encounter (Signed)
Kristi Anderson, CMA  

## 2020-10-18 ENCOUNTER — Other Ambulatory Visit: Payer: Self-pay

## 2020-10-18 ENCOUNTER — Other Ambulatory Visit (HOSPITAL_COMMUNITY)
Admission: RE | Admit: 2020-10-18 | Discharge: 2020-10-18 | Disposition: A | Discharge status: Home / Self Care | Payer: BC Managed Care – PPO | Source: Ambulatory Visit | Attending: Gastroenterology | Admitting: Gastroenterology

## 2020-10-18 DIAGNOSIS — Z8759 Personal history of other complications of pregnancy, childbirth and the puerperium: Secondary | ICD-10-CM | POA: Diagnosis not present

## 2020-10-18 DIAGNOSIS — Z7722 Contact with and (suspected) exposure to environmental tobacco smoke (acute) (chronic): Secondary | ICD-10-CM | POA: Diagnosis not present

## 2020-10-18 DIAGNOSIS — K648 Other hemorrhoids: Secondary | ICD-10-CM | POA: Diagnosis not present

## 2020-10-18 DIAGNOSIS — Z885 Allergy status to narcotic agent status: Secondary | ICD-10-CM | POA: Diagnosis not present

## 2020-10-18 DIAGNOSIS — Z833 Family history of diabetes mellitus: Secondary | ICD-10-CM | POA: Diagnosis not present

## 2020-10-18 DIAGNOSIS — K625 Hemorrhage of anus and rectum: Secondary | ICD-10-CM | POA: Diagnosis present

## 2020-10-18 DIAGNOSIS — Z01812 Encounter for preprocedural laboratory examination: Secondary | ICD-10-CM

## 2020-10-18 DIAGNOSIS — Z91040 Latex allergy status: Secondary | ICD-10-CM | POA: Diagnosis not present

## 2020-10-18 DIAGNOSIS — Z8632 Personal history of gestational diabetes: Secondary | ICD-10-CM | POA: Diagnosis not present

## 2020-10-18 DIAGNOSIS — K5900 Constipation, unspecified: Secondary | ICD-10-CM | POA: Diagnosis not present

## 2020-10-18 LAB — PREGNANCY, URINE: Preg Test, Ur: NEGATIVE

## 2020-10-20 ENCOUNTER — Ambulatory Visit (HOSPITAL_COMMUNITY): Payer: BC Managed Care – PPO | Admitting: Anesthesiology

## 2020-10-20 ENCOUNTER — Other Ambulatory Visit: Payer: Self-pay

## 2020-10-20 ENCOUNTER — Encounter (HOSPITAL_COMMUNITY)
Admission: RE | Disposition: A | Discharge status: Home / Self Care | Payer: Self-pay | Source: Home / Self Care | Attending: Gastroenterology

## 2020-10-20 ENCOUNTER — Ambulatory Visit (HOSPITAL_COMMUNITY)
Admission: RE | Admit: 2020-10-20 | Discharge: 2020-10-20 | Disposition: A | Discharge status: Home / Self Care | Payer: BC Managed Care – PPO | Attending: Gastroenterology | Admitting: Gastroenterology

## 2020-10-20 ENCOUNTER — Encounter (HOSPITAL_COMMUNITY): Payer: Self-pay | Admitting: Gastroenterology

## 2020-10-20 DIAGNOSIS — K5900 Constipation, unspecified: Secondary | ICD-10-CM | POA: Insufficient documentation

## 2020-10-20 DIAGNOSIS — Z833 Family history of diabetes mellitus: Secondary | ICD-10-CM | POA: Insufficient documentation

## 2020-10-20 DIAGNOSIS — Z885 Allergy status to narcotic agent status: Secondary | ICD-10-CM | POA: Insufficient documentation

## 2020-10-20 DIAGNOSIS — K648 Other hemorrhoids: Secondary | ICD-10-CM | POA: Diagnosis not present

## 2020-10-20 DIAGNOSIS — Z91040 Latex allergy status: Secondary | ICD-10-CM | POA: Insufficient documentation

## 2020-10-20 DIAGNOSIS — Z8759 Personal history of other complications of pregnancy, childbirth and the puerperium: Secondary | ICD-10-CM | POA: Insufficient documentation

## 2020-10-20 DIAGNOSIS — Z7722 Contact with and (suspected) exposure to environmental tobacco smoke (acute) (chronic): Secondary | ICD-10-CM | POA: Insufficient documentation

## 2020-10-20 DIAGNOSIS — Z8632 Personal history of gestational diabetes: Secondary | ICD-10-CM | POA: Insufficient documentation

## 2020-10-20 DIAGNOSIS — K625 Hemorrhage of anus and rectum: Secondary | ICD-10-CM | POA: Diagnosis not present

## 2020-10-20 HISTORY — PX: COLONOSCOPY WITH PROPOFOL: SHX5780

## 2020-10-20 SURGERY — COLONOSCOPY WITH PROPOFOL
Anesthesia: General

## 2020-10-20 MED ORDER — STERILE WATER FOR IRRIGATION IR SOLN
Status: DC | PRN
  Administered 2020-10-20: 200 mL

## 2020-10-20 MED ORDER — PROPOFOL 10 MG/ML IV BOLUS
INTRAVENOUS | Status: DC | PRN
  Administered 2020-10-20: 30 mg via INTRAVENOUS
  Administered 2020-10-20: 70 mg via INTRAVENOUS
  Administered 2020-10-20: 40 mg via INTRAVENOUS
  Administered 2020-10-20: 100 mg via INTRAVENOUS

## 2020-10-20 MED ORDER — LACTATED RINGERS IV SOLN
INTRAVENOUS | Status: DC
  Administered 2020-10-20: 1000 mL via INTRAVENOUS

## 2020-10-20 MED ORDER — LIDOCAINE HCL (CARDIAC) PF 100 MG/5ML IV SOSY
PREFILLED_SYRINGE | INTRAVENOUS | Status: DC | PRN
  Administered 2020-10-20: 50 mg via INTRAVENOUS

## 2020-10-20 MED ORDER — PROPOFOL 500 MG/50ML IV EMUL
INTRAVENOUS | Status: DC | PRN
  Administered 2020-10-20: 150 ug/kg/min via INTRAVENOUS

## 2020-10-20 NOTE — Discharge Instructions (Addendum)
You are being discharged to home.  Resume your previous diet.  Your physician has recommended a repeat colonoscopy at age 30 (please contact the clinic prior to your 43th birthday to schedule) for screening purposes.  Start Miralax 1 capful daily.

## 2020-10-20 NOTE — Transfer of Care (Signed)
Immediate Anesthesia Transfer of Care Note  Patient: Kristi Anderson  Procedure(s) Performed: COLONOSCOPY WITH PROPOFOL  Patient Location: Endoscopy Unit  Anesthesia Type:General  Level of Consciousness: drowsy  Airway & Oxygen Therapy: Patient Spontanous Breathing  Post-op Assessment: Report given to RN and Post -op Vital signs reviewed and stable  Post vital signs: Reviewed and stable  Last Vitals:  Vitals Value Taken Time  BP    Temp    Pulse    Resp    SpO2      Last Pain:  Vitals:   10/20/20 1111  TempSrc:   PainSc: 0-No pain      Patients Stated Pain Goal: 7 (10/20/20 1019)  Complications: No notable events documented.

## 2020-10-20 NOTE — Anesthesia Procedure Notes (Signed)
Date/Time: 10/20/2020 11:17 AM Performed by: Julian Reil, CRNA Pre-anesthesia Checklist: Patient identified, Emergency Drugs available, Suction available and Patient being monitored Patient Re-evaluated:Patient Re-evaluated prior to induction Oxygen Delivery Method: Nasal cannula Induction Type: IV induction Placement Confirmation: positive ETCO2

## 2020-10-20 NOTE — Anesthesia Preprocedure Evaluation (Addendum)
Anesthesia Evaluation  Patient identified by MRN, date of birth, ID band Patient awake    Reviewed: Allergy & Precautions, NPO status , Patient's Chart, lab work & pertinent test results  History of Anesthesia Complications Negative for: history of anesthetic complications  Airway Mallampati: II  TM Distance: >3 FB Neck ROM: Full    Dental  (+) Dental Advisory Given, Teeth Intact   Pulmonary neg pulmonary ROS,    Pulmonary exam normal breath sounds clear to auscultation       Cardiovascular Exercise Tolerance: Good hypertension (preeclampsis), Normal cardiovascular exam Rhythm:Regular Rate:Normal     Neuro/Psych PSYCHIATRIC DISORDERS Anxiety PTSDnegative neurological ROS     GI/Hepatic negative GI ROS, Neg liver ROS,   Endo/Other  diabetes (gestational )  Renal/GU negative Renal ROS     Musculoskeletal negative musculoskeletal ROS (+)   Abdominal   Peds  Hematology negative hematology ROS (+)   Anesthesia Other Findings   Reproductive/Obstetrics negative OB ROS                          Anesthesia Physical Anesthesia Plan  ASA: 2  Anesthesia Plan: General   Post-op Pain Management:    Induction: Intravenous  PONV Risk Score and Plan: Propofol infusion  Airway Management Planned: Nasal Cannula and Natural Airway  Additional Equipment:   Intra-op Plan:   Post-operative Plan:   Informed Consent: I have reviewed the patients History and Physical, chart, labs and discussed the procedure including the risks, benefits and alternatives for the proposed anesthesia with the patient or authorized representative who has indicated his/her understanding and acceptance.     Dental advisory given  Plan Discussed with: CRNA and Surgeon  Anesthesia Plan Comments:         Anesthesia Quick Evaluation

## 2020-10-20 NOTE — Interval H&P Note (Signed)
History and Physical Interval Note:  10/20/2020 10:30 AM Kristi Anderson is a 30 y.o. female with past medical history of PTSD anxiety, who presents for evaluation of mucus in stool, rectal bleeding and constipation.  The patient reports that she felt better after she starting MiraLAX daily but she only had this for a week as she did not know she could take this permanently.  States that she is having less abdominal discomfort and is moving her bowels more frequently almost on a daily basis.  Denies any more mucus or blood in her stool, states that her stool is not formed.  BP 113/75   Pulse 73   Temp 98.3 F (36.8 C) (Oral)   Resp 16   Ht 5\' 3"  (1.6 m)   Wt 59.4 kg   SpO2 99%   BMI 23.21 kg/m  GENERAL: The patient is AO x3, in no acute distress. HEENT: Head is normocephalic and atraumatic. EOMI are intact. Mouth is well hydrated and without lesions. NECK: Supple. No masses LUNGS: Clear to auscultation. No presence of rhonchi/wheezing/rales. Adequate chest expansion HEART: RRR, normal s1 and s2. ABDOMEN: Soft, nontender, no guarding, no peritoneal signs, and nondistended. BS +. No masses. EXTREMITIES: Without any cyanosis, clubbing, rash, lesions or edema. NEUROLOGIC: AOx3, no focal motor deficit. SKIN: no jaundice, no rashes   Kristi Anderson  has presented today for surgery, with the diagnosis of Rectal Bleeing Mucos In Stool.  The various methods of treatment have been discussed with the patient and family. After consideration of risks, benefits and other options for treatment, the patient has consented to  Procedure(s) with comments: COLONOSCOPY WITH PROPOFOL (N/A) - 10:45 as a surgical intervention.  The patient's history has been reviewed, patient examined, no change in status, stable for surgery.  I have reviewed the patient's chart and labs.  Questions were answered to the patient's satisfaction.     Pricilla Riffle Mayorga

## 2020-10-20 NOTE — Op Note (Signed)
[facility] Patient Name: Kristi Anderson Procedure Date: 10/20/2020 11:04 AM MRN: [id number] Date of Birth: 01-14-1991 Attending MD: [name] ,  CSN: [id number] Age: 30 Admit Type: Outpatient Procedure:                Colonoscopy Indications:              Rectal bleeding Providers:                [name], Crystal Page, Edythe Clarity,                            Technician Referring MD:              Medicines:                Monitored Anesthesia Care Complications:            No immediate complications. Estimated Blood Loss:     Estimated blood loss: none. Procedure:                Pre-Anesthesia Assessment:                           - Prior to the procedure, a History and Physical                            was performed, and patient medications, allergies                            and sensitivities were reviewed. The patient's                            tolerance of previous anesthesia was reviewed.                           - The risks and benefits of the procedure and the                            sedation options and risks were discussed with the                            patient. All questions were answered and informed                            consent was obtained.                           - ASA Grade Assessment: II - A patient with mild                            systemic disease.                           After obtaining informed consent, the colonoscope                            was passed under direct vision. Throughout the  procedure, the patient's blood pressure, pulse, and                            oxygen saturations were monitored continuously. The                            [id number]-[id number] ([id number]) scope was introduced through                            the anus and advanced to the the cecum, identified                            by appendiceal orifice and ileocecal valve. The                            colonoscopy was  performed without difficulty. The                            patient tolerated the procedure well. The quality                            of the bowel preparation was good. Scope In: 11:14:50 AM Scope Out: 11:33:01 AM Scope Withdrawal Time: 0 hours 11 minutes 55 seconds  Total Procedure Duration: 0 hours 18 minutes 11 seconds  Findings:      The perianal and digital rectal examinations were normal.      The colon (entire examined portion) appeared normal.      Non-bleeding internal hemorrhoids were found during retroflexion. The       hemorrhoids were small. Impression:               - The entire examined colon is normal.                           - Non-bleeding internal hemorrhoids.                           - No specimens collected. Moderate Sedation:      Per Anesthesia Care Recommendation:           - Discharge patient to home (ambulatory).                           - Resume previous diet.                           - Repeat colonoscopy at age 68 for screening                            purposes.                           - Start Miralax 1 capful daily. Procedure Code(s):        --- Professional ---                           [phone], Colonoscopy, flexible; diagnostic, including  collection of specimen(s) by brushing or washing,                            when performed (separate procedure) Diagnosis Code(s):        --- Professional ---                           K64.8, Other hemorrhoids                           K62.5, Hemorrhage of anus and rectum CPT copyright 2019 American Medical Association. All rights reserved. The codes documented in this report are preliminary and upon coder review may  be revised to meet current compliance requirements. [name], MD [name],  10/20/2020 11:42:09 AM This report has been signed electronically. Number of Addenda: 0

## 2020-10-20 NOTE — Anesthesia Postprocedure Evaluation (Signed)
Anesthesia Post Note  Patient: Kristi Anderson  Procedure(s) Performed: COLONOSCOPY WITH PROPOFOL  Patient location during evaluation: Endoscopy Anesthesia Type: General Level of consciousness: awake and alert and oriented Pain management: pain level controlled Vital Signs Assessment: post-procedure vital signs reviewed and stable Respiratory status: spontaneous breathing and respiratory function stable Cardiovascular status: blood pressure returned to baseline and stable Postop Assessment: no apparent nausea or vomiting Anesthetic complications: no   No notable events documented.   Last Vitals:  Vitals:   10/20/20 1137 10/20/20 1142  BP: (!) 82/57 (!) 94/49  Pulse:    Resp: 11   Temp: 36.8 C   SpO2: 99%     Last Pain:  Vitals:   10/20/20 1142  TempSrc:   PainSc: 0-No pain                 [name] C Daoud Lobue

## 2020-10-26 ENCOUNTER — Encounter (HOSPITAL_COMMUNITY): Payer: Self-pay | Admitting: Gastroenterology

## 2020-11-10 ENCOUNTER — Other Ambulatory Visit: Payer: BC Managed Care – PPO

## 2020-11-12 ENCOUNTER — Other Ambulatory Visit (INDEPENDENT_AMBULATORY_CARE_PROVIDER_SITE_OTHER): Payer: BC Managed Care – PPO | Admitting: *Deleted

## 2020-11-12 ENCOUNTER — Other Ambulatory Visit (HOSPITAL_COMMUNITY)
Admission: RE | Admit: 2020-11-12 | Discharge: 2020-11-12 | Disposition: A | Discharge status: Home / Self Care | Payer: BC Managed Care – PPO | Source: Ambulatory Visit | Attending: Obstetrics & Gynecology | Admitting: Obstetrics & Gynecology

## 2020-11-12 ENCOUNTER — Other Ambulatory Visit: Payer: Self-pay

## 2020-11-12 DIAGNOSIS — N898 Other specified noninflammatory disorders of vagina: Secondary | ICD-10-CM | POA: Diagnosis not present

## 2020-11-12 DIAGNOSIS — R3915 Urgency of urination: Secondary | ICD-10-CM | POA: Diagnosis not present

## 2020-11-12 LAB — POCT URINALYSIS DIPSTICK OB
Glucose, UA: NEGATIVE
Ketones, UA: NEGATIVE
Nitrite, UA: NEGATIVE
POC,PROTEIN,UA: NEGATIVE

## 2020-11-12 NOTE — Progress Notes (Signed)
   NURSE VISIT- UTI SYMPTOMS   SUBJECTIVE:  Kristi Anderson is a 30 y.o. [id number] female here for UTI symptoms. She is a GYN patient. She reports urinary urgency.  OBJECTIVE:  There were no vitals taken for this visit.  Appears well, in no apparent distress  No results found for this or any previous visit (from the past 24 hour(s)).  ASSESSMENT: GYN patient with UTI symptoms and negative nitrites  PLAN: Note routed to Cyril Mourning, AGNP   Rx sent by provider today: No Urine culture sent Call or return to clinic prn if these symptoms worsen or fail to improve as anticipated. Follow-up: as needed   Annamarie Dawley  11/12/2020 9:17 AM    Also sent swab for bv yeast trich gc.ch due to vaginal itching.

## 2020-11-12 NOTE — Progress Notes (Signed)
Chart reviewed for nurse visit. Agree with plan of care.  Adline Potter, NP 11/12/2020 2:30 PM

## 2020-11-14 LAB — URINE CULTURE

## 2020-11-15 LAB — CERVICOVAGINAL ANCILLARY ONLY
Bacterial Vaginitis (gardnerella): NEGATIVE
Candida Glabrata: NEGATIVE
Candida Vaginitis: NEGATIVE
Chlamydia: NEGATIVE
Comment: NEGATIVE
Comment: NEGATIVE
Comment: NEGATIVE
Comment: NEGATIVE
Comment: NEGATIVE
Comment: NORMAL
Neisseria Gonorrhea: NEGATIVE
Trichomonas: NEGATIVE

## 2020-12-17 ENCOUNTER — Encounter: Payer: Self-pay | Admitting: Emergency Medicine

## 2020-12-17 ENCOUNTER — Ambulatory Visit
Admission: EM | Admit: 2020-12-17 | Discharge: 2020-12-17 | Disposition: A | Discharge status: Home / Self Care | Payer: BC Managed Care – PPO | Attending: Emergency Medicine | Admitting: Emergency Medicine

## 2020-12-17 ENCOUNTER — Other Ambulatory Visit: Payer: Self-pay

## 2020-12-17 DIAGNOSIS — R059 Cough, unspecified: Secondary | ICD-10-CM

## 2020-12-17 DIAGNOSIS — J069 Acute upper respiratory infection, unspecified: Secondary | ICD-10-CM

## 2020-12-17 MED ORDER — BENZONATATE 100 MG PO CAPS: 100.0000 mg | ORAL_CAPSULE | Freq: Three times a day (TID) | ORAL | 0 refills | Status: DC

## 2020-12-17 NOTE — ED Provider Notes (Signed)
[facility]   [id number] 12/17/20 Arrival Time: 1129   CC: COVID symptoms  SUBJECTIVE: History from: patient  Kristi Anderson is a 30 y.o. female who presents with sore throat, cough, and sneezing x 3 days.  Admits to positive covid exposure.  Denies alleviating or aggravating factors.  Denies previous symptoms in the past.   Denies fever, chills, SOB, wheezing, chest pain, nausea, changes in bowel or bladder habits.     ROS: As per HPI.  All other pertinent ROS negative.     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   Current Outpatient Medications on File Prior to Encounter  Medication Sig Dispense Refill   Ascorbic Acid (VITAMIN C WITH ROSE HIPS) 500 MG tablet Take 500 mg by mouth daily.     Cholecalciferol (VITAMIN D3) 125 MCG (5000 UT) TABS Take 5,000 Units by mouth daily.     topiramate (TOPAMAX) 25 MG tablet Take 25 mg by mouth in the morning and at bedtime.     Social History   Socioeconomic History   Marital status: Married    Spouse name: Advertising copywriter   Number of children: 1   Years of education: Not on file   Highest education level: Associate degree: academic program   Occupational History   Not on file  Tobacco Use   Smoking status: Passive Smoke Exposure - Never Smoker   Smokeless tobacco: Never  Vaping Use   Vaping Use: Never used  Substance and Sexual Activity   Alcohol use: No    Alcohol/week: 0.0 standard drinks   Drug use: No   Sexual activity: Yes    Partners: Male    Birth control/protection: Pill, Condom  Other Topics Concern   Not on file  Social History Narrative   ** Merged History Encounter **       Social Determinants of Health   Financial Resource Strain: Not on file  Food Insecurity: Not on file  Transportation Needs: Not on file  Physical Activity: Not on file  Stress: Not on file  Social Connections: Not on file  Intimate Partner Violence: Not on file   Family History  Problem Relation Age of Onset   Arthritis Mother    Heart disease Mother    Clotting disorder Father    Heart failure Father    Stroke Maternal Grandmother    Heart disease Maternal Grandfather    Early death Maternal Grandfather    Arthritis Paternal Grandmother    Diabetes Paternal Grandfather    Seizures Sister     OBJECTIVE:  Vitals:   12/17/20 1213  BP: 105/73  Pulse: 100  Resp: 18  Temp: 97.8 F (36.6 C)  TempSrc: Oral  SpO2: 97%    General appearance: alert; appears fatigued, but nontoxic; speaking in full sentences and tolerating own secretions HEENT: NCAT; Ears: EACs clear, TMs pearly gray; Eyes: PERRL.  EOM grossly intact. Nose: nares patent without rhinorrhea, Throat: oropharynx clear, tonsils non erythematous or enlarged, uvula midline  Neck: supple without LAD Lungs: unlabored respirations, symmetrical air entry; cough: mild; no respiratory distress; CTAB Heart: regular rate and rhythm.   Skin: warm and dry Psychological: alert and cooperative; normal mood and affect  ASSESSMENT & PLAN:  1. Cough   2. Viral URI with cough     Meds ordered this encounter  Medications   benzonatate (TESSALON) 100 MG capsule     Sig: Take 1 capsule (100 mg total) by mouth every 8 (eight) hours.    Dispense:  21 capsule    Refill:  0    Order Specific Question:   Supervising Provider    Answer:   Eustace Moore [[id number]]   COVID testing ordered.  It will take between 5-7 days for test results.  Someone will contact you regarding abnormal results.    In the meantime: You should remain isolated in your home for 5 days from symptom onset AND greater than 72 hours after symptoms resolution (absence of fever without the use of fever-reducing medication and improvement in respiratory symptoms), whichever is longer Get plenty of rest and push fluids Tessalon Perles prescribed for cough Use OTC zyrtec for nasal congestion, runny nose, and/or sore throat Use OTC flonase for nasal congestion and runny nose Use medications daily for symptom relief Use OTC medications like ibuprofen or tylenol as needed fever or pain Call or go to the ED if you have any new or worsening symptoms such as fever, worsening cough, shortness of breath, chest tightness, chest pain, turning blue, changes in mental status, etc...   Reviewed expectations re: course of current medical issues. Questions answered. Outlined signs and symptoms indicating need for more acute intervention. Patient verbalized understanding. After Visit Summary given.          Rennis Harding, PA-C 12/17/20 1239

## 2020-12-17 NOTE — ED Triage Notes (Signed)
Sore throat , cough and sneezing since Tuesday.  Was around a group of covid + people

## 2020-12-17 NOTE — Discharge Instructions (Signed)

## 2020-12-18 LAB — SARS-COV-2, NAA 2 DAY TAT

## 2020-12-18 LAB — NOVEL CORONAVIRUS, NAA: SARS-CoV-2, NAA: NOT DETECTED

## 2021-02-04 ENCOUNTER — Encounter (HOSPITAL_COMMUNITY): Payer: BC Managed Care – PPO

## 2021-02-04 DIAGNOSIS — Z1231 Encounter for screening mammogram for malignant neoplasm of breast: Secondary | ICD-10-CM

## 2021-06-07 ENCOUNTER — Other Ambulatory Visit: Payer: BC Managed Care – PPO | Admitting: Adult Health

## 2021-07-15 ENCOUNTER — Other Ambulatory Visit (HOSPITAL_COMMUNITY)
Admission: RE | Admit: 2021-07-15 | Discharge: 2021-07-15 | Disposition: A | Discharge status: Home / Self Care | Payer: BC Managed Care – PPO | Source: Ambulatory Visit | Attending: Adult Health | Admitting: Adult Health

## 2021-07-15 ENCOUNTER — Encounter: Payer: Self-pay | Admitting: Adult Health

## 2021-07-15 ENCOUNTER — Ambulatory Visit (INDEPENDENT_AMBULATORY_CARE_PROVIDER_SITE_OTHER): Payer: BC Managed Care – PPO | Admitting: Adult Health

## 2021-07-15 VITALS — BP 113/79 | HR 76 | Ht 63.0 in | Wt 123.0 lb

## 2021-07-15 DIAGNOSIS — Z01419 Encounter for gynecological examination (general) (routine) without abnormal findings: Secondary | ICD-10-CM | POA: Insufficient documentation

## 2021-07-15 DIAGNOSIS — N6321 Unspecified lump in the left breast, upper outer quadrant: Secondary | ICD-10-CM | POA: Diagnosis not present

## 2021-07-15 DIAGNOSIS — N3281 Overactive bladder: Secondary | ICD-10-CM | POA: Diagnosis not present

## 2021-07-15 LAB — POCT URINALYSIS DIPSTICK
Blood, UA: NEGATIVE
Glucose, UA: NEGATIVE
Leukocytes, UA: NEGATIVE
Nitrite, UA: NEGATIVE
Protein, UA: NEGATIVE

## 2021-07-15 MED ORDER — OXYBUTYNIN CHLORIDE ER 10 MG PO TB24
10.0000 mg | ORAL_TABLET | Freq: Every day | ORAL | 2 refills | Status: AC
Start: 2021-07-15 — End: 2022-07-15

## 2021-07-15 NOTE — Progress Notes (Signed)
Patient ID: Kristi Anderson, female   DOB: Jan 28, 1991, 31 y.o.   MRN: [id number] ?History of Present Illness: ?Kristi Anderson is a 31 year old white female, married, G2P2 in for a well woman gyn exam and pap. She has felt knot in left breast and is having to pee frequently, does drink a lot of water but may get up 6 x at night. ?PCP is Dr Phillips Odor. ? ? ?Current Medications, Allergies, Past Medical History, Past Surgical History, Family History and Social History were reviewed in [employer] record.   ? ? ?Review of Systems: ?Patient denies any headaches, hearing loss, fatigue, blurred vision, shortness of breath, chest pain, abdominal pain, problems with bowel movements, or intercourse. No joint pain or mood swings.  ?See HPI for positives ? ? ?Physical Exam:BP 113/79 (BP Location: Right Arm, Patient Position: Sitting, Cuff Size: Normal)   Pulse 76   Ht 5\' 3"  (1.6 m)   Wt 123 lb (55.8 kg)   LMP 07/10/2021   BMI 21.79 kg/m?  urine dipstick is negative ?General:  Well developed, well nourished, no acute distress ?Skin:  Warm and dry ?Neck:  Midline trachea, normal thyroid, good ROM, no lymphadenopathy ?Lungs; Clear to auscultation bilaterally ?Breast:  No dominant palpable mass, retraction, or nipple discharge, on right on left, no retraction or nipple discharge, but has pea sized mass at 1 0' clock about FB from nipple, has bilateral regular irregularities. ?Cardiovascular: Regular rate and rhythm ?Abdomen:  Soft, non tender, no hepatosplenomegaly ?Pelvic:  External genitalia is normal in appearance, no lesions.  The vagina is normal in appearance. Urethra has no lesions or masses. The cervix is bulbous. Pap with GC/CHL and HR HPV genotyping performed. Uterus is felt to be normal size, shape, and contour.  No adnexal masses or tenderness noted.Bladder is non tender, no masses felt. ?Rectal:Deferred ?Extremities/musculoskeletal:  No swelling or varicosities noted, no clubbing or cyanosis ?Psych:  No  mood changes, alert and cooperative,seems happy ?AA is 0 ?Fall risk is low ? ?  07/15/2021  ? 10:14 AM 06/07/2018  ?  9:24 AM 04/26/2018  ? 10:20 AM  ?Depression screen PHQ 2/9  ?Decreased Interest 0  0  ?Down, Depressed, Hopeless 1 0 0  ?PHQ - 2 Score 1 0 0  ?Altered sleeping 1 1   ?Tired, decreased energy 0 0   ?Change in appetite 0 0   ?Feeling bad or failure about yourself  0 0   ?Trouble concentrating 0 0   ?Moving slowly or fidgety/restless 0 0   ?Suicidal thoughts 0 0   ?PHQ-9 Score 2 1   ?Difficult doing work/chores  Not difficult at all   ?  ? ?  07/15/2021  ? 10:14 AM  ?GAD 7 : Generalized Anxiety Score  ?Nervous, Anxious, on Edge 1  ?Control/stop worrying 1  ?Worry too much - different things 1  ?Trouble relaxing 0  ?Restless 0  ?Easily annoyed or irritable 1  ?Afraid - awful might happen 1  ?Total GAD 7 Score 5  ? ? Upstream - 07/15/21 1016   ? ?  ? Pregnancy Intention Screening  ? Does the patient want to become pregnant in the next year? No   ? Does the patient's partner want to become pregnant in the next year? No   ? Would the patient like to discuss contraceptive options today? No   ?  ? Contraception Wrap Up  ? Current Method Withdrawal or Other Method   ? End Method Withdrawal or  Other Method   ? Contraception Counseling Provided No   ? ?  ?  ? ?  ?  ? Examination chaperoned by Faith Rogue LPN ? ? ?Impression and Plan: ?1. Encounter for gynecological examination with Papanicolaou smear of cervix ?Pap sent ?Physical in 1 year ?Pap in 3 if normal ?- Cytology - PAP( [place]) ? ?2. Mass of upper outer quadrant of left breast ?Scheduled diagnostic mammogram for her 08/09/21 at [facility] at 11 am ?- MM DIAG BREAST TOMO BILATERAL; Future ?- US BREAST LTD UNI LEFT INC AXILLA; Future ?- US BREAST LTD UNI RIGHT INC AXILLA; Future ? ?3. OAB (overactive bladder) ?Will try ditropan and limit fluids after 7 pm ?Let me know if helps or not, can refer to urologist if desired  ?Meds ordered this encounter   ?Medications  ? oxybutynin (DITROPAN XL) 10 MG 24 hr tablet  ?  Sig: Take 1 tablet (10 mg total) by mouth daily.  ?  Dispense:  30 tablet  ?  Refill:  2  ?  Order Specific Question:   Supervising Provider  ?  Answer:   Duane Lope H [[id number]]  ?  ?- POCT urinalysis dipstick  ? ? ? ?  ?  ?

## 2021-07-19 LAB — CYTOLOGY - PAP
Chlamydia: NEGATIVE
Comment: NEGATIVE
Comment: NEGATIVE
Comment: NORMAL
Diagnosis: NEGATIVE
High risk HPV: NEGATIVE
Neisseria Gonorrhea: NEGATIVE

## 2021-08-09 ENCOUNTER — Ambulatory Visit (HOSPITAL_COMMUNITY)
Admission: RE | Admit: 2021-08-09 | Discharge: 2021-08-09 | Disposition: A | Discharge status: Home / Self Care | Payer: BC Managed Care – PPO | Source: Ambulatory Visit | Attending: Adult Health | Admitting: Adult Health

## 2021-08-09 DIAGNOSIS — N6321 Unspecified lump in the left breast, upper outer quadrant: Secondary | ICD-10-CM

## 2021-08-09 DIAGNOSIS — R928 Other abnormal and inconclusive findings on diagnostic imaging of breast: Secondary | ICD-10-CM | POA: Diagnosis not present

## 2021-08-09 DIAGNOSIS — N6489 Other specified disorders of breast: Secondary | ICD-10-CM | POA: Diagnosis not present

## 2021-08-11 ENCOUNTER — Other Ambulatory Visit: Payer: Self-pay | Admitting: Adult Health

## 2021-08-11 DIAGNOSIS — N3281 Overactive bladder: Secondary | ICD-10-CM

## 2021-08-11 NOTE — Progress Notes (Signed)
Referred to urology for OAB ?

## 2021-08-26 ENCOUNTER — Encounter: Payer: Self-pay | Admitting: Urology

## 2021-08-26 ENCOUNTER — Ambulatory Visit (INDEPENDENT_AMBULATORY_CARE_PROVIDER_SITE_OTHER): Payer: BC Managed Care – PPO | Admitting: Urology

## 2021-08-26 VITALS — BP 133/81 | HR 109 | Ht 63.0 in | Wt 121.0 lb

## 2021-08-26 DIAGNOSIS — N3281 Overactive bladder: Secondary | ICD-10-CM

## 2021-08-26 LAB — MICROSCOPIC EXAMINATION: Renal Epithel, UA: NONE SEEN /hpf

## 2021-08-26 LAB — URINALYSIS, ROUTINE W REFLEX MICROSCOPIC
Bilirubin, UA: NEGATIVE
Glucose, UA: NEGATIVE
Ketones, UA: NEGATIVE
Nitrite, UA: NEGATIVE
Protein,UA: NEGATIVE
Specific Gravity, UA: 1.01 (ref 1.005–1.030)
Urobilinogen, Ur: 0.2 mg/dL (ref 0.2–1.0)
pH, UA: 7 (ref 5.0–7.5)

## 2021-08-26 LAB — BLADDER SCAN AMB NON-IMAGING: Scan Result: 13

## 2021-08-26 MED ORDER — MIRABEGRON ER 25 MG PO TB24: 25.0000 mg | ORAL_TABLET | Freq: Every day | ORAL | 0 refills | Status: DC

## 2021-08-26 NOTE — Progress Notes (Signed)
post void residual =13mL 

## 2021-08-26 NOTE — Patient Instructions (Signed)

## 2021-08-26 NOTE — Progress Notes (Signed)
08/26/2021 12:44 PM   Pricilla Riffle 05/07/1990 [id number]  Referring provider: Adline Potter, NP [address] [place],  [place] [address]  Urinary frequency   HPI: Ms Quevedo is a 31yo here for evaluation of urinary frequency. For the past 2-3 years she has noted increased urinary frequency every 30 minutes and nocturia 4-5x. Each void is a small volume. She has urinary urgency and occasional urge incontinence. She tried oxybutynin which failed to improve her LUTS. She has tingling in her fingers and toes. No spine issues. She has constipation and takes fiber and miralax prn. She has a BM every 2-3 days. No vision changes. PVR 13cc.    PMH: Past Medical History:  Diagnosis Date   Allergy    morphine and latex   Anxiety    Gestational diabetes    Preeclampsia, severe 01/10/2016   Pregnant 06/01/2015   PTSD (post-traumatic stress disorder)    had dog attack 11/15 had 4 surgeries on left ear   PTSD (post-traumatic stress disorder)    Vitamin D deficiency     Surgical History: Past Surgical History:  Procedure Laterality Date   COLONOSCOPY WITH PROPOFOL N/A 10/20/2020   Procedure: COLONOSCOPY WITH PROPOFOL;  Surgeon: Dolores Frame, MD;  [facility];  Service: Gastroenterology;  Laterality: N/A;  10:45   COSMETIC SURGERY     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 If you have any questions, ask your nurse or doctor.          oxybutynin 10 MG 24 hr tablet Commonly known as: Ditropan XL Take 1 tablet (10 mg total) by mouth daily.   topiramate 25 MG tablet Commonly known as: TOPAMAX Take 25 mg by mouth in the morning and at bedtime.   vitamin C with rose hips 500 MG tablet Take 500 mg by mouth daily.   Vitamin  D3 125 MCG (5000 UT) Tabs Take 5,000 Units by mouth daily.        Allergies:  Allergies  Allergen Reactions   Morphine And Related Nausea Only   Latex Swelling    Family History: Family History  Problem Relation Age of Onset   Arthritis Mother    Heart disease Mother    Clotting disorder Father    Heart failure Father    Stroke Maternal Grandmother    Heart disease Maternal Grandfather    Early death Maternal Grandfather    Arthritis Paternal Grandmother    Diabetes Paternal Grandfather    Seizures Sister     Social History:  reports that she has never smoked. She has been exposed to tobacco smoke. She has never used smokeless tobacco. She reports that she does not drink alcohol and does not use drugs.  ROS: All other review of systems were reviewed and are negative except what is noted above in HPI  Physical Exam: BP 133/81   Pulse (!) 109   Ht 5\' 3"  (1.6 m)   Wt 121 lb (54.9 kg)   LMP 08/09/2021   BMI 21.43 kg/m   Constitutional:  Alert and oriented, No acute distress. HEENT: [place] AT, moist mucus membranes.  Trachea midline, no  masses. Cardiovascular: No clubbing, cyanosis, or edema. Respiratory: Normal respiratory effort, no increased work of breathing. GI: Abdomen is soft, nontender, nondistended, no abdominal masses GU: No CVA tenderness.  Lymph: No cervical or inguinal lymphadenopathy. Skin: No rashes, bruises or suspicious lesions. Neurologic: Grossly intact, no focal deficits, moving all 4 extremities. Psychiatric: Normal mood and affect.  Laboratory Data: Lab Results  Component Value Date   WBC 11.2 (H) 12/08/2018   HGB 14.3 12/08/2018   HCT 41.4 12/08/2018   MCV 93.0 12/08/2018   PLT 183 12/08/2018    Lab Results  Component Value Date   CREATININE 0.55 (L) 06/06/2018    No results found for: PSA  No results found for: TESTOSTERONE  No results found for: HGBA1C  Urinalysis    Component Value Date/Time   COLORURINE YELLOW 01/10/2016  1457   APPEARANCEUR Clear 07/22/2020 1352   LABSPEC 1.020 01/10/2016 1457   PHURINE 6.5 01/10/2016 1457   GLUCOSEU Negative 11/12/2020 0919   GLUCOSEU Negative 07/22/2020 1352   HGBUR MODERATE (A) 01/10/2016 1457   BILIRUBINUR Negative 07/22/2020 1352   KETONESUR NEGATIVE 01/10/2016 1457   PROTEINUR Negative 07/15/2021 1044   PROTEINUR Negative 07/22/2020 1352   PROTEINUR >300 (A) 01/10/2016 1457   UROBILINOGEN negative 11/23/2015 1415   NITRITE neg 07/15/2021 1044   NITRITE Negative 07/22/2020 1352   NITRITE NEGATIVE 01/10/2016 1457   LEUKOCYTESUR Negative 07/15/2021 1044   LEUKOCYTESUR Negative 07/22/2020 1352    Lab Results  Component Value Date   LABMICR See below: 07/22/2020   WBCUA 0-5 07/22/2020   RBCUA None seen 06/07/2018   LABEPIT 0-10 07/22/2020   MUCUS Present 06/07/2018   BACTERIA None seen 07/22/2020    Pertinent Imaging:  No results found for this or any previous visit.  No results found for this or any previous visit.  No results found for this or any previous visit.  No results found for this or any previous visit.  No results found for this or any previous visit.  No results found for this or any previous visit.  No results found for this or any previous visit.  No results found for this or any previous visit.   Assessment & Plan:    1. OAB (overactive bladder) -We will trial mirabegron 25mg  daily.  -Patient instructed to take miralax 17g daily.  _RTC 4 weeks  - BLADDER SCAN AMB NON-IMAGING - Urinalysis, Routine w reflex microscopic   No follow-ups on file.  , MD  [facility] [place]

## 2021-09-28 ENCOUNTER — Ambulatory Visit: Payer: BC Managed Care – PPO | Admitting: Urology

## 2021-10-12 ENCOUNTER — Other Ambulatory Visit: Payer: Self-pay | Admitting: Adult Health

## 2021-12-06 ENCOUNTER — Telehealth: Payer: BC Managed Care – PPO | Admitting: Physician Assistant

## 2021-12-06 DIAGNOSIS — J069 Acute upper respiratory infection, unspecified: Secondary | ICD-10-CM | POA: Diagnosis not present

## 2021-12-06 MED ORDER — FLUTICASONE PROPIONATE 50 MCG/ACT NA SUSP: 2.0000 | Freq: Every day | NASAL | 0 refills | Status: DC

## 2021-12-06 NOTE — Progress Notes (Signed)
I have spent 5 minutes in review of e-visit questionnaire, review and updating patient chart, medical decision making and response to patient.   Jaxxon Naeem Cody Kristina Bertone, PA-C    

## 2021-12-06 NOTE — Progress Notes (Signed)

## 2021-12-22 ENCOUNTER — Other Ambulatory Visit (HOSPITAL_COMMUNITY): Payer: Self-pay | Admitting: Adult Health

## 2021-12-22 DIAGNOSIS — R928 Other abnormal and inconclusive findings on diagnostic imaging of breast: Secondary | ICD-10-CM

## 2021-12-22 DIAGNOSIS — N6310 Unspecified lump in the right breast, unspecified quadrant: Secondary | ICD-10-CM

## 2022-01-20 ENCOUNTER — Other Ambulatory Visit (INDEPENDENT_AMBULATORY_CARE_PROVIDER_SITE_OTHER): Payer: BC Managed Care – PPO

## 2022-01-20 DIAGNOSIS — R3915 Urgency of urination: Secondary | ICD-10-CM | POA: Diagnosis not present

## 2022-01-20 DIAGNOSIS — R300 Dysuria: Secondary | ICD-10-CM | POA: Diagnosis not present

## 2022-01-20 LAB — POCT URINALYSIS DIPSTICK OB
Glucose, UA: NEGATIVE
Ketones, UA: NEGATIVE
Nitrite, UA: NEGATIVE
POC,PROTEIN,UA: NEGATIVE

## 2022-01-20 NOTE — Progress Notes (Signed)
   NURSE VISIT- UTI SYMPTOMS   SUBJECTIVE:  Kristi Anderson is a 31 y.o. G75P2002 female here for UTI symptoms. She is a GYN patient. She reports dysuria and urinary urgency that started about 2 days ago.  States last night she got up about 7 times during the night to use the bathroom and only a little urine came out each time.  OBJECTIVE:  There were no vitals taken for this visit.  Appears well, in no apparent distress  Results for orders placed or performed in visit on 01/20/22 (from the past 24 hour(s))  POC Urinalysis Dipstick OB   Collection Time: 01/20/22  1:18 PM  Result Value Ref Range   Color, UA     Clarity, UA     Glucose, UA Negative Negative   Bilirubin, UA     Ketones, UA neg    Spec Grav, UA     Blood, UA moderate    pH, UA     POC,PROTEIN,UA Negative Negative, Trace, Small (1+), Moderate (2+), Large (3+), 4+   Urobilinogen, UA     Nitrite, UA neg    Leukocytes, UA Trace (A) Negative   Appearance     Odor      ASSESSMENT: GYN patient with UTI symptoms and negative nitrites  PLAN: Note routed to Derrek Monaco, AGNP   Rx sent by provider today: No Urine culture sent Call or return to clinic prn if these symptoms worsen or fail to improve as anticipated. Follow-up: as needed   Kristi Anderson  01/20/2022 1:19 PM

## 2022-01-21 LAB — MICROSCOPIC EXAMINATION
Casts: NONE SEEN /lpf
WBC, UA: NONE SEEN /hpf (ref 0–5)

## 2022-01-21 LAB — URINALYSIS, ROUTINE W REFLEX MICROSCOPIC
Bilirubin, UA: NEGATIVE
Glucose, UA: NEGATIVE
Ketones, UA: NEGATIVE
Leukocytes,UA: NEGATIVE
Nitrite, UA: NEGATIVE
Specific Gravity, UA: >=1.03 — AB (ref 1.005–1.030)
Urobilinogen, Ur: 0.2 mg/dL (ref 0.2–1.0)
pH, UA: 5.5 (ref 5.0–7.5)

## 2022-01-23 LAB — URINE CULTURE

## 2022-02-08 ENCOUNTER — Ambulatory Visit (HOSPITAL_COMMUNITY)
Admission: RE | Admit: 2022-02-08 | Discharge: 2022-02-08 | Disposition: A | Discharge status: Home / Self Care | Payer: BC Managed Care – PPO | Source: Ambulatory Visit | Attending: Adult Health | Admitting: Adult Health

## 2022-02-08 ENCOUNTER — Encounter (HOSPITAL_COMMUNITY): Payer: Self-pay

## 2022-02-08 DIAGNOSIS — N6310 Unspecified lump in the right breast, unspecified quadrant: Secondary | ICD-10-CM | POA: Insufficient documentation

## 2022-02-08 DIAGNOSIS — N6311 Unspecified lump in the right breast, upper outer quadrant: Secondary | ICD-10-CM | POA: Diagnosis not present

## 2022-02-14 ENCOUNTER — Encounter (HOSPITAL_COMMUNITY): Payer: BC Managed Care – PPO

## 2022-02-14 ENCOUNTER — Ambulatory Visit (HOSPITAL_COMMUNITY): Payer: BC Managed Care – PPO

## 2022-07-10 ENCOUNTER — Ambulatory Visit
Admission: RE | Admit: 2022-07-10 | Discharge: 2022-07-10 | Disposition: A | Discharge status: Home / Self Care | Payer: BC Managed Care – PPO | Source: Ambulatory Visit | Attending: Internal Medicine | Admitting: Internal Medicine

## 2022-07-10 VITALS — BP 110/74 | HR 87 | Temp 98.3°F | Resp 16

## 2022-07-10 DIAGNOSIS — H6121 Impacted cerumen, right ear: Secondary | ICD-10-CM

## 2022-07-10 DIAGNOSIS — H6001 Abscess of right external ear: Secondary | ICD-10-CM

## 2022-07-10 MED ORDER — DOXYCYCLINE HYCLATE 100 MG PO CAPS: 100.0000 mg | ORAL_CAPSULE | Freq: Two times a day (BID) | ORAL | 0 refills | Status: AC

## 2022-07-10 NOTE — ED Provider Notes (Signed)
[facility]    CSN: [id number] Arrival date & time: 07/10/22  0807      History   Chief Complaint Chief Complaint  Patient presents with   Ear Fullness    Ear pain. - Entered by patient   Otalgia    HPI Kristi Anderson is a 32 y.o. female who had new  pina piercing 2 months ago, and one moth ago developed a lump and she took the earing off, and then 2 months ago when she was cleaning it, she noticed a bad smell from it. She also has deep ear pain off and on x 1 week. Denies hx of URI    Past Medical History:  Diagnosis Date   Allergy    morphine and latex   Anxiety    Gestational diabetes    Preeclampsia, severe 01/10/2016   Pregnant 06/01/2015   PTSD (post-traumatic stress disorder)    had dog attack 11/15 had 4 surgeries on left ear   PTSD (post-traumatic stress disorder)    Vitamin D deficiency     Patient Active Problem List   Diagnosis Date Noted   Mass of upper outer quadrant of left breast 07/15/2021   Encounter for gynecological examination with Papanicolaou smear of cervix 07/15/2021   OAB (overactive bladder) 07/15/2021   Rectal bleeding 09/20/2020   Mucus in stool 09/20/2020   Constipation 09/20/2020   Encounter for initial prescription of contraceptive pills 08/15/2019   History of gestational diabetes 10/08/2018   Hx of preeclampsia 04/26/2018   Vitamin D deficiency 05/03/2015   Post traumatic stress disorder 11/18/2014    Past Surgical History:  Procedure Laterality Date   COLONOSCOPY WITH PROPOFOL N/A 10/20/2020   Procedure: COLONOSCOPY WITH PROPOFOL;  Surgeon: Dolores Frame, MD;  [facility];  Service: Gastroenterology;  Laterality: N/A;  10:45   COSMETIC SURGERY     reconstructive surgery after attack by dog   EXTERNAL EAR SURGERY      OB History     Gravida  2   Para  2   Term  2   Preterm  0   AB  0   Living  2      SAB  0   IAB  0   Ectopic  0   Multiple  0   Live Births  2             Home Medications    Prior to Admission medications   Medication Sig Start Date End Date Taking? Authorizing Provider  doxycycline (VIBRAMYCIN) 100 MG capsule Take 1 capsule (100 mg total) by mouth 2 (two) times daily for 7 days. 07/10/22 07/17/22 Yes Rodriguez-Southworth, Nettie Elm, PA-C  topiramate (TOPAMAX) 25 MG tablet Take 25 mg by mouth in the morning and at bedtime.   Yes [provider]  oxybutynin (DITROPAN XL) 10 MG 24 hr tablet Take 1 tablet (10 mg total) by mouth daily. 07/15/21 07/15/22  Adline Potter, NP    Family History Family History  Problem Relation Age of Onset   Arthritis Mother    Heart disease Mother    Clotting disorder Father    Heart failure Father    Stroke Maternal Grandmother    Heart disease Maternal Grandfather    Early death Maternal Grandfather    Arthritis Paternal Grandmother    Diabetes Paternal Grandfather    Seizures Sister     Social History Social History   Tobacco Use   Smoking status: Never  Passive exposure: Yes   Smokeless tobacco: Never  Vaping Use   Vaping Use: Never used  Substance Use Topics   Alcohol use: No    Alcohol/week: 0.0 standard drinks of alcohol   Drug use: No     Allergies   Morphine and related and Latex   Review of Systems Review of Systems  Constitutional:  Negative for fever.  HENT:  Positive for ear discharge and ear pain. Negative for congestion and rhinorrhea.   Respiratory:  Negative for cough.   Skin:  Positive for color change and wound.  Hematological:  Negative for adenopathy.     Physical Exam Triage Vital Signs ED Triage Vitals [07/10/22 0835]  Enc Vitals Group     BP 110/74     Pulse Rate 87     Resp 16     Temp 98.3 F (36.8 C)     Temp Source Oral     SpO2 98 %     Weight      Height      Head Circumference      Peak Flow      Pain Score 6     Pain Loc      Pain Edu?      Excl. in GC?    No data found.  Updated Vital Signs BP 110/74 (BP  Location: Right Arm)   Pulse 87   Temp 98.3 F (36.8 C) (Oral)   Resp 16   LMP 06/09/2022 (Approximate)   SpO2 98%   Visual Acuity Right Eye Distance:   Left Eye Distance:   Bilateral Distance:    Right Eye Near:   Left Eye Near:    Bilateral Near:     Physical Exam Vitals and nursing note reviewed.  Constitutional:      General: She is not in acute distress.    Appearance: She is normal weight. She is not toxic-appearing.  HENT:     Left Ear: Tympanic membrane, ear canal and external ear normal. There is no impacted cerumen.     Ears:     Comments: Pina with mild swelling posteriorly, has central scab on anterior area. Is mildly tender.TM was not visible due to wax, but after lavage TM is healthy Eyes:     General: No scleral icterus.    Conjunctiva/sclera: Conjunctivae normal.  Pulmonary:     Effort: Pulmonary effort is normal.  Musculoskeletal:        General: Normal range of motion.     Cervical back: Neck supple.  Lymphadenopathy:     Cervical: No cervical adenopathy.  Skin:    General: Skin is warm and dry.  Neurological:     Mental Status: She is alert and oriented to person, place, and time.     Gait: Gait normal.  Psychiatric:        Mood and Affect: Mood normal.        Behavior: Behavior normal.        Thought Content: Thought content normal.      UC Treatments / Results  Labs (all labs ordered are listed, but only abnormal results are displayed) Labs Reviewed - No data to display  EKG   Radiology No results found.  Procedures Procedures (including critical care time)  Medications Ordered in UC Medications - No data to display  Initial Impression / Assessment and Plan / UC Course  I have reviewed the triage vital signs and the nursing notes.  R ear lavage was done and  large piece of wax was removed. More likely this was causing pressure on the TM I placed her on Doxy as noted. FU prn.    Final Clinical Impressions(s) / UC Diagnoses    Final diagnoses:  Abscess of right earlobe  Impacted cerumen of right ear   Discharge Instructions   None    ED Prescriptions     Medication Sig Dispense Auth. Provider   doxycycline (VIBRAMYCIN) 100 MG capsule Take 1 capsule (100 mg total) by mouth 2 (two) times daily for 7 days. 14 capsule Rodriguez-Southworth, Nettie ElmSylvia, PA-C      PDMP not reviewed this encounter.   Garey HamRodriguez-Southworth, Larsen Zettel, [place]PA-C 07/10/22 [phone]

## 2022-07-10 NOTE — ED Triage Notes (Signed)
Pt states she had a ear piercing 2 months ago and is now having bad smell, bump at site and swelling with ear pain on right ear.

## 2022-08-25 ENCOUNTER — Encounter: Payer: Self-pay | Admitting: Nurse Practitioner

## 2022-08-25 ENCOUNTER — Ambulatory Visit
Admission: EM | Admit: 2022-08-25 | Discharge: 2022-08-25 | Disposition: A | Discharge status: Home / Self Care | Payer: BC Managed Care – PPO | Attending: Nurse Practitioner | Admitting: Nurse Practitioner

## 2022-08-25 DIAGNOSIS — J029 Acute pharyngitis, unspecified: Secondary | ICD-10-CM

## 2022-08-25 DIAGNOSIS — J0110 Acute frontal sinusitis, unspecified: Secondary | ICD-10-CM | POA: Diagnosis not present

## 2022-08-25 LAB — POCT RAPID STREP A (OFFICE): Rapid Strep A Screen: NEGATIVE

## 2022-08-25 MED ORDER — AMOXICILLIN-POT CLAVULANATE 875-125 MG PO TABS: 1.0000 | ORAL_TABLET | Freq: Two times a day (BID) | ORAL | 0 refills | Status: DC

## 2022-08-25 MED ORDER — PROMETHAZINE-DM 6.25-15 MG/5ML PO SYRP: 5.0000 mL | ORAL_SOLUTION | Freq: Four times a day (QID) | ORAL | 0 refills | Status: DC | PRN

## 2022-08-25 NOTE — Discharge Instructions (Addendum)
The rapid strep test is negative, a throat culture is pending.  You will be contacted if the pending test result is positive. Take medication as directed. Consider starting allergy medication if you have a history of seasonal allergies. Increase fluids and get plenty of rest. May take over-the-counter ibuprofen or Tylenol as needed for pain, fever, or general discomfort. Recommend normal saline nasal spray to help with nasal congestion throughout the day. Warm salt water gargles 3-4 times daily while throat pain persist. Continue use of Chloraseptic throat spray or other similar throat spray to help with throat pain or discomfort. For your cough, it may be helpful to use a humidifier and sleeping elevated on pillows.  If symptoms fail to improve after this treatment, please follow-up with your primary care physician for further evaluation. Follow-up as needed.

## 2022-08-25 NOTE — ED Triage Notes (Signed)
Pt c/o congestion cough and sore throat x 5 days, hoarseness raspy cough swallowing hurts. No fever.

## 2022-08-25 NOTE — ED Provider Notes (Signed)
[facility]    CSN: [id number] Arrival date & time: 08/25/22  0802      History   Chief Complaint No chief complaint on file.   HPI Kristi Anderson is a 32 y.o. female.   The history is provided by the patient.   The patient presents with a 5-day history of nasal congestion, sore throat, right ear pressure, and cough.  Patient states over the past 24 hours, she has began to feel worse.  States cough and sore throat are worse at night.  She denies fever, chills, ear drainage, wheezing, shortness of breath, difficulty breathing, abdominal pain, nausea, vomiting, or diarrhea.  Patient reports that she has been taking DayQuil, NyQuil, and using Flonase with minimal relief.  Patient denies any obvious known sick contacts.  States that she did take a home COVID test, results were negative.  Past Medical History:  Diagnosis Date   Allergy    morphine and latex   Anxiety    Gestational diabetes    Preeclampsia, severe 01/10/2016   Pregnant 06/01/2015   PTSD (post-traumatic stress disorder)    had dog attack 11/15 had 4 surgeries on left ear   PTSD (post-traumatic stress disorder)    Vitamin D deficiency     Patient Active Problem List   Diagnosis Date Noted   Mass of upper outer quadrant of left breast 07/15/2021   Encounter for gynecological examination with Papanicolaou smear of cervix 07/15/2021   OAB (overactive bladder) 07/15/2021   Rectal bleeding 09/20/2020   Mucus in stool 09/20/2020   Constipation 09/20/2020   Encounter for initial prescription of contraceptive pills 08/15/2019   History of gestational diabetes 10/08/2018   Hx of preeclampsia 04/26/2018   Vitamin D deficiency 05/03/2015   Post traumatic stress disorder 11/18/2014    Past Surgical History:  Procedure Laterality Date   COLONOSCOPY WITH PROPOFOL N/A 10/20/2020   Procedure: COLONOSCOPY WITH PROPOFOL;  Surgeon: Dolores Frame, MD;  Location: [facility];  Service:  Gastroenterology;  Laterality: N/A;  10:45   COSMETIC SURGERY     reconstructive surgery after attack by dog   EXTERNAL EAR SURGERY      OB History     Gravida  2   Para  2   Term  2   Preterm  0   AB  0   Living  2      SAB  0   IAB  0   Ectopic  0   Multiple  0   Live Births  2            Home Medications    Prior to Admission medications   Medication Sig Start Date End Date Taking? Authorizing Provider  amoxicillin-clavulanate (AUGMENTIN) 875-125 MG tablet Take 1 tablet by mouth every 12 (twelve) hours. 08/25/22  Yes Onisha Cedeno-Warren, Sadie Haber, NP  promethazine-dextromethorphan (PROMETHAZINE-DM) 6.25-15 MG/5ML syrup Take 5 mLs by mouth 4 (four) times daily as needed for cough. 08/25/22  Yes Ava Deguire-Warren, Sadie Haber, NP  topiramate (TOPAMAX) 25 MG tablet Take 25 mg by mouth in the morning and at bedtime.    [provider]    Family History Family History  Problem Relation Age of Onset   Arthritis Mother    Heart disease Mother    Clotting disorder Father    Heart failure Father    Stroke Maternal Grandmother    Heart disease Maternal Grandfather    Early death Maternal Grandfather    Arthritis Paternal Grandmother  Diabetes Paternal Grandfather    Seizures Sister     Social History Social History   Tobacco Use   Smoking status: Never    Passive exposure: Yes   Smokeless tobacco: Never  Vaping Use   Vaping Use: Never used  Substance Use Topics   Alcohol use: No    Alcohol/week: 0.0 standard drinks of alcohol   Drug use: No     Allergies   Morphine and codeine and Latex   Review of Systems Review of Systems Per HPI  Physical Exam Triage Vital Signs ED Triage Vitals  Enc Vitals Group     BP 08/25/22 0817 126/80     Pulse Rate 08/25/22 0817 85     Resp 08/25/22 0817 16     Temp 08/25/22 0817 98.2 F (36.8 C)     Temp Source 08/25/22 0817 Oral     SpO2 08/25/22 0817 98 %     Weight --      Height --      Head  Circumference --      Peak Flow --      Pain Score 08/25/22 0818 8     Pain Loc --      Pain Edu? --      Excl. in GC? --    No data found.  Updated Vital Signs BP 126/80 (BP Location: Right Arm)   Pulse 85   Temp 98.2 F (36.8 C) (Oral)   Resp 16   LMP 08/14/2022 (Within Days)   SpO2 98%   Visual Acuity Right Eye Distance:   Left Eye Distance:   Bilateral Distance:    Right Eye Near:   Left Eye Near:    Bilateral Near:     Physical Exam Vitals and nursing note reviewed.  Constitutional:      General: She is not in acute distress.    Appearance: Normal appearance.  HENT:     Head: Normocephalic.     Right Ear: Ear canal and external ear normal. A middle ear effusion is present.     Left Ear: Tympanic membrane, ear canal and external ear normal.     Nose: Congestion present. No rhinorrhea.     Right Turbinates: Enlarged and swollen.     Left Turbinates: Enlarged and swollen.     Right Sinus: Frontal sinus tenderness present. No maxillary sinus tenderness.     Left Sinus: Frontal sinus tenderness present. No maxillary sinus tenderness.     Mouth/Throat:     Mouth: Mucous membranes are moist.     Pharynx: Posterior oropharyngeal erythema present.     Comments: Cobblestoning present on posterior oropharynx Eyes:     Extraocular Movements: Extraocular movements intact.     Conjunctiva/sclera: Conjunctivae normal.     Pupils: Pupils are equal, round, and reactive to light.  Cardiovascular:     Rate and Rhythm: Normal rate and regular rhythm.     Pulses: Normal pulses.     Heart sounds: Normal heart sounds.  Pulmonary:     Effort: Pulmonary effort is normal. No respiratory distress.     Breath sounds: Normal breath sounds. No stridor. No wheezing, rhonchi or rales.  Abdominal:     General: Bowel sounds are normal.     Palpations: Abdomen is soft.     Tenderness: There is no abdominal tenderness.  Musculoskeletal:     Cervical back: Normal range of motion.   Lymphadenopathy:     Cervical: No cervical adenopathy.  Skin:  General: Skin is warm and dry.  Neurological:     General: No focal deficit present.     Mental Status: She is alert and oriented to person, place, and time.  Psychiatric:        Mood and Affect: Mood normal.        Behavior: Behavior normal.      UC Treatments / Results  Labs (all labs ordered are listed, but only abnormal results are displayed) Labs Reviewed  POCT RAPID STREP A (OFFICE) - Normal  CULTURE, GROUP A STREP [facility])    EKG   Radiology No results found.  Procedures Procedures (including critical care time)  Medications Ordered in UC Medications - No data to display  Initial Impression / Assessment and Plan / UC Course  I have reviewed the triage vital signs and the nursing notes.  Pertinent labs & imaging results that were available during my care of the patient were reviewed by me and considered in my medical decision making (see chart for details).  The patient is well-appearing, she is in no acute distress, vital signs are stable.  Rapid strep test is negative, throat culture is pending.  Patient conducted home COVID test which was negative.  Symptoms appear to be consistent with bacterial sinusitis with worsening over the past 24 hours.  Patient endorses purulent nasal drainage, and worsening cough.  Will treat patient with Augmentin 875/125 mg tablets along with Promethazine DM for her cough.  Patient advised to continue use of Flonase.  Supportive care recommendations were provided and discussed with the patient to include over-the-counter analgesics such as ibuprofen or Tylenol for pain or discomfort, normal saline nasal spray, and sleeping elevated on pillows and using a humidifier to help with the cough.  Patient was given indications of when follow-up may be necessary.  Patient is in agreement with this plan of care, and verbalizes understanding.  All questions were answered.  Patient  stable for discharge.   Final Clinical Impressions(s) / UC Diagnoses   Final diagnoses:  Acute frontal sinusitis, recurrence not specified  Sore throat     Discharge Instructions      The rapid strep test is negative, a throat culture is pending.  You will be contacted if the pending test result is positive. Take medication as directed. Consider starting allergy medication if you have a history of seasonal allergies. Increase fluids and get plenty of rest. May take over-the-counter ibuprofen or Tylenol as needed for pain, fever, or general discomfort. Recommend normal saline nasal spray to help with nasal congestion throughout the day. Warm salt water gargles 3-4 times daily while throat pain persist. Continue use of Chloraseptic throat spray or other similar throat spray to help with throat pain or discomfort. For your cough, it may be helpful to use a humidifier and sleeping elevated on pillows.  If symptoms fail to improve after this treatment, please follow-up with your primary care physician for further evaluation. Follow-up as needed.     ED Prescriptions     Medication Sig Dispense Auth. Provider   amoxicillin-clavulanate (AUGMENTIN) 875-125 MG tablet Take 1 tablet by mouth every 12 (twelve) hours. 14 tablet Melenie Minniear-Warren, Sadie Haber, NP   promethazine-dextromethorphan (PROMETHAZINE-DM) 6.25-15 MG/5ML syrup Take 5 mLs by mouth 4 (four) times daily as needed for cough. 118 mL Lakelynn Severtson-Warren, Sadie Haber, NP      PDMP not reviewed this encounter.   Abran Cantor, NP 08/25/22 [phone]

## 2022-12-28 ENCOUNTER — Other Ambulatory Visit (HOSPITAL_COMMUNITY): Payer: Self-pay | Admitting: Internal Medicine

## 2022-12-28 DIAGNOSIS — G43111 Migraine with aura, intractable, with status migrainosus: Secondary | ICD-10-CM

## 2023-01-05 ENCOUNTER — Ambulatory Visit (HOSPITAL_COMMUNITY)
Admission: RE | Admit: 2023-01-05 | Discharge: 2023-01-05 | Disposition: A | Discharge status: Home / Self Care | Payer: BC Managed Care – PPO | Source: Ambulatory Visit | Attending: Internal Medicine | Admitting: Internal Medicine

## 2023-01-05 DIAGNOSIS — G43119 Migraine with aura, intractable, without status migrainosus: Secondary | ICD-10-CM | POA: Diagnosis not present

## 2023-01-05 DIAGNOSIS — G43111 Migraine with aura, intractable, with status migrainosus: Secondary | ICD-10-CM | POA: Insufficient documentation

## 2023-02-19 ENCOUNTER — Telehealth: Payer: BC Managed Care – PPO | Admitting: Physician Assistant

## 2023-02-19 DIAGNOSIS — R051 Acute cough: Secondary | ICD-10-CM | POA: Diagnosis not present

## 2023-02-19 MED ORDER — BENZONATATE 100 MG PO CAPS: 100.0000 mg | ORAL_CAPSULE | Freq: Three times a day (TID) | ORAL | 0 refills | Status: DC | PRN

## 2023-02-19 NOTE — Progress Notes (Signed)
E-Visit for Cough  We are sorry that you are not feeling well.  Here is how we plan to help!  Based on your presentation I believe you most likely have A cough due to a virus.  This is called viral bronchitis and is best treated by rest, plenty of fluids and control of the cough.  You may use Ibuprofen or Tylenol as directed to help your symptoms.     In addition you may use A non-prescription cough medication called Robitussin DAC. Take 2 teaspoons every 8 hours or Delsym: take 2 teaspoons every 12 hours., A non-prescription cough medication called Mucinex DM: take 2 tablets every 12 hours., and A prescription cough medication called Tessalon Perles 100mg. You may take 1-2 capsules every 8 hours as needed for your cough.    From your responses in the eVisit questionnaire you describe inflammation in the upper respiratory tract which is causing a significant cough.  This is commonly called Bronchitis and has four common causes:   Allergies Viral Infections Acid Reflux Bacterial Infection Allergies, viruses and acid reflux are treated by controlling symptoms or eliminating the cause. An example might be a cough caused by taking certain blood pressure medications. You stop the cough by changing the medication. Another example might be a cough caused by acid reflux. Controlling the reflux helps control the cough.  USE OF BRONCHODILATOR ("RESCUE") INHALERS: There is a risk from using your bronchodilator too frequently.  The risk is that over-reliance on a medication which only relaxes the muscles surrounding the breathing tubes can reduce the effectiveness of medications prescribed to reduce swelling and congestion of the tubes themselves.  Although you feel brief relief from the bronchodilator inhaler, your asthma may actually be worsening with the tubes becoming more swollen and filled with mucus.  This can delay other crucial treatments, such as oral steroid medications. If you need to use a  bronchodilator inhaler daily, several times per day, you should discuss this with your provider.  There are probably better treatments that could be used to keep your asthma under control.     HOME CARE Only take medications as instructed by your medical team. Complete the entire course of an antibiotic. Drink plenty of fluids and get plenty of rest. Avoid close contacts especially the very young and the elderly Cover your mouth if you cough or cough into your sleeve. Always remember to wash your hands A steam or ultrasonic humidifier can help congestion.   GET HELP RIGHT AWAY IF: You develop worsening fever. You become short of breath You cough up blood. Your symptoms persist after you have completed your treatment plan MAKE SURE YOU  Understand these instructions. Will watch your condition. Will get help right away if you are not doing well or get worse.    Thank you for choosing an e-visit.  Your e-visit answers were reviewed by a board certified advanced clinical practitioner to complete your personal care plan. Depending upon the condition, your plan could have included both over the counter or prescription medications.  Please review your pharmacy choice. Make sure the pharmacy is open so you can pick up prescription now. If there is a problem, you may contact your provider through MyChart messaging and have the prescription routed to another pharmacy.  Your safety is important to us. If you have drug allergies check your prescription carefully.   For the next 24 hours you can use MyChart to ask questions about today's visit, request a non-urgent call back,   or ask for a work or school excuse. You will get an email in the next two days asking about your experience. I hope that your e-visit has been valuable and will speed your recovery.  I have spent 5 minutes in review of e-visit questionnaire, review and updating patient chart, medical decision making and response to patient.    Breeonna Mone Z Ward, PA-C     

## 2023-03-15 ENCOUNTER — Telehealth: Payer: BC Managed Care – PPO | Admitting: Physician Assistant

## 2023-03-15 DIAGNOSIS — K047 Periapical abscess without sinus: Secondary | ICD-10-CM | POA: Diagnosis not present

## 2023-03-15 MED ORDER — NAPROXEN 500 MG PO TABS
500.0000 mg | ORAL_TABLET | Freq: Two times a day (BID) | ORAL | 0 refills | Status: DC
Start: 2023-03-15 — End: 2024-01-24

## 2023-03-15 MED ORDER — AMOXICILLIN-POT CLAVULANATE 875-125 MG PO TABS
1.0000 | ORAL_TABLET | Freq: Two times a day (BID) | ORAL | 0 refills | Status: DC
Start: 2023-03-15 — End: 2024-01-24

## 2023-03-15 NOTE — Progress Notes (Signed)

## 2023-03-15 NOTE — Progress Notes (Signed)
I have spent 5 minutes in review of e-visit questionnaire, review and updating patient chart, medical decision making and response to patient.   Mia Milan Cody Jacklynn Dehaas, PA-C    

## 2023-03-16 ENCOUNTER — Ambulatory Visit: Payer: BC Managed Care – PPO

## 2023-04-28 ENCOUNTER — Telehealth: Payer: BC Managed Care – PPO | Admitting: Nurse Practitioner

## 2023-04-28 DIAGNOSIS — J069 Acute upper respiratory infection, unspecified: Secondary | ICD-10-CM

## 2023-04-28 MED ORDER — PSEUDOEPH-BROMPHEN-DM 30-2-10 MG/5ML PO SYRP
5.0000 mL | ORAL_SOLUTION | Freq: Four times a day (QID) | ORAL | 0 refills | Status: DC | PRN
Start: 2023-04-28 — End: 2024-01-24

## 2023-04-28 MED ORDER — FLUTICASONE PROPIONATE 50 MCG/ACT NA SUSP
2.0000 | Freq: Every day | NASAL | 0 refills | Status: DC
Start: 2023-04-28 — End: 2024-01-24

## 2023-04-28 NOTE — Progress Notes (Signed)
I have spent 5 minutes in review of e-visit questionnaire, review and updating patient chart, medical decision making and response to patient.   Claiborne Rigg, NP

## 2023-04-28 NOTE — Progress Notes (Signed)
E-Visit for Cough   We are sorry that you are not feeling well.  Here is how we plan to help!  Based on your presentation I believe you most likely have A cough due to a virus.  This is called viral bronchitis and is best treated by rest, plenty of fluids and control of the cough.  You may use Ibuprofen or Tylenol as directed to help your symptoms.     I have sent a steroid nasal spray for your nasal symptoms and a prescription cough syrup for your cough  From your responses in the eVisit questionnaire you describe inflammation in the upper respiratory tract which is causing a significant cough.  This is commonly called Bronchitis and has four common causes:   Allergies Viral Infections Acid Reflux Bacterial Infection Allergies, viruses and acid reflux are treated by controlling symptoms or eliminating the cause. An example might be a cough caused by taking certain blood pressure medications. You stop the cough by changing the medication. Another example might be a cough caused by acid reflux. Controlling the reflux helps control the cough.  USE OF BRONCHODILATOR ("RESCUE") INHALERS: There is a risk from using your bronchodilator too frequently.  The risk is that over-reliance on a medication which only relaxes the muscles surrounding the breathing tubes can reduce the effectiveness of medications prescribed to reduce swelling and congestion of the tubes themselves.  Although you feel brief relief from the bronchodilator inhaler, your asthma may actually be worsening with the tubes becoming more swollen and filled with mucus.  This can delay other crucial treatments, such as oral steroid medications. If you need to use a bronchodilator inhaler daily, several times per day, you should discuss this with your provider.  There are probably better treatments that could be used to keep your asthma under control.     HOME CARE Only take medications as instructed by your medical team. Complete the  entire course of an antibiotic. Drink plenty of fluids and get plenty of rest. Avoid close contacts especially the very young and the elderly Cover your mouth if you cough or cough into your sleeve. Always remember to wash your hands A steam or ultrasonic humidifier can help congestion.   GET HELP RIGHT AWAY IF: You develop worsening fever. You become short of breath You cough up blood. Your symptoms persist after you have completed your treatment plan MAKE SURE YOU  Understand these instructions. Will watch your condition. Will get help right away if you are not doing well or get worse.    Thank you for choosing an e-visit.  Your e-visit answers were reviewed by a board certified advanced clinical practitioner to complete your personal care plan. Depending upon the condition, your plan could have included both over the counter or prescription medications.  Please review your pharmacy choice. Make sure the pharmacy is open so you can pick up prescription now. If there is a problem, you may contact your provider through [employer] and have the prescription routed to another pharmacy.  Your safety is important to [place]. If you have drug allergies check your prescription carefully.   For the next 24 hours you can use MyChart to ask questions about today's visit, request a non-urgent call back, or ask for a work or school excuse. You will get an email in the next two days asking about your experience. I hope that your e-visit has been valuable and will speed your recovery.

## 2023-07-19 DIAGNOSIS — B354 Tinea corporis: Secondary | ICD-10-CM | POA: Diagnosis not present

## 2023-07-19 DIAGNOSIS — Z6824 Body mass index (BMI) 24.0-24.9, adult: Secondary | ICD-10-CM | POA: Diagnosis not present

## 2024-01-24 ENCOUNTER — Telehealth: Admitting: Physician Assistant

## 2024-01-24 DIAGNOSIS — J069 Acute upper respiratory infection, unspecified: Secondary | ICD-10-CM | POA: Diagnosis not present

## 2024-01-24 MED ORDER — PROMETHAZINE-DM 6.25-15 MG/5ML PO SYRP
5.0000 mL | ORAL_SOLUTION | Freq: Four times a day (QID) | ORAL | 0 refills | Status: AC | PRN
Start: 2024-01-24 — End: ?

## 2024-01-24 NOTE — Progress Notes (Signed)
 We are sorry you are not feeling well.  Here is how we plan to help!  Based on what you have shared with me, it looks like you may have a viral upper respiratory infection.  Upper respiratory infections are caused by a large number of viruses; however, rhinovirus is the most common cause.   Symptoms vary from person to person, with common symptoms including sore throat, cough, and fatigue or lack of energy, and a feeling of general discomfort.  A low-grade fever of up to 100.4 may present, but is often uncommon.  Symptoms vary however, and are closely related to a person's age or underlying illnesses.  The most common symptoms associated with an upper respiratory infection are nasal discharge or congestion, cough, sneezing, headache and pressure in the ears and face.  These symptoms usually persist for about 3 to 10 days, but can last up to 2 weeks.  It is important to know that upper respiratory infections do not cause serious illness or complications in most cases.    Upper respiratory infections can be transmitted from person to person, with the most common method of transmission being a person's hands.  The virus is able to live on the skin and can infect other persons for up to 2 hours after direct contact.  Also, these can be transmitted when someone coughs or sneezes; thus, it is important to cover the mouth to reduce this risk.  To keep the spread of the illness at bay, good hand hygiene is very important!  Because this is a viral infection, there are no specific treatments other than to help you with the symptoms until the infection runs its course.    For nasal congestion, you may use an oral decongestants such as Mucinex D or if you have glaucoma or high blood pressure use plain Mucinex.  Saline nasal spray or nasal drops can help and can safely be used as often as needed for congestion.    If you do not have a history of heart disease, hypertension, diabetes or thyroid  disease,  prostate/bladder issues or glaucoma, you may also use Sudafed to treat nasal congestion.  It is highly recommended that you consult with a pharmacist or your primary care physician to ensure this medication is safe for you to take.      For cough I have prescribed for you A prescription cough medication called Promethazine -DM 6.25-15 mg/5 mL. Take 5 mL every 6 hours as needed for cough.  If you have a sore or scratchy throat, use a saltwater gargle-  to  teaspoon of salt dissolved in a 4-ounce to 8-ounce glass of warm water .  Gargle the solution for approximately 15-30 seconds and then spit.  It is important not to swallow the solution.  You can also use throat lozenges/cough drops and Chloraseptic spray to help with throat pain or discomfort.  Warm or cold liquids can also be helpful in relieving throat pain.   For headache, pain, or general discomfort, you can use Ibuprofen  or Tylenol  as directed.   Some authorities believe that zinc sprays or the use of Echinacea may shorten the course of your symptoms.   HOME CARE Only take medications as instructed by your medical team. Be sure to drink plenty of fluids. Water  is fine as well as fruit juices, sodas and electrolyte beverages. You may want to stay away from caffeine or alcohol. If you are nauseated, try taking small sips of liquids. How do you know if you are getting  enough fluid? Your urine should be a pale yellow or almost colorless. Get rest. Taking a steamy shower or using a humidifier may help nasal congestion and ease sore throat pain. You can place a towel over your head and breathe in the steam from hot water  coming from a faucet. Using a saline nasal spray works much the same way. Cough drops, hard candies and sore throat lozenges may ease your cough. Avoid close contacts especially the very young and the elderly Cover your mouth if you cough or sneeze Always remember to wash your hands.   GET HELP RIGHT AWAY IF: You develop  worsening fever. If your symptoms do not improve within 10 days You develop yellow or green discharge from your nose over 3 days. You have coughing fits You develop a severe head ache or visual changes. You develop shortness of breath, difficulty breathing or start having chest pain Your symptoms persist after you have completed your treatment plan  MAKE SURE YOU  Understand these instructions. Will watch your condition. Will get help right away if you are not doing well or get worse.  Your e-visit answers were reviewed by a board certified advanced clinical practitioner to complete your personal care plan. Depending upon the condition, your plan could have included both over-the-counter or prescription medications.  Please review your pharmacy choice. If there is a problem, you may call our nursing hot line at and have the prescription routed to another pharmacy. Your safety is important to us . If you have drug allergies, check your prescription carefully.   You can use MyChart to ask questions about today's visit, request a non-urgent call back, or ask for a work or school excuse for 24 hours related to this e-Visit. If it has been greater than 24 hours you will need to follow up with your provider, or enter a new e-Visit to address those concerns. You will get an e-mail in the next two days asking about your experience.  I hope that your e-visit has been valuable and will speed your recovery. Thank you for using e-visits.   I have spent 5 minutes in review of e-visit questionnaire, review and updating patient chart, medical decision making and response to patient.   Elsie Velma Lunger, PA-C

## 2024-01-30 ENCOUNTER — Telehealth: Admitting: Physician Assistant

## 2024-01-30 DIAGNOSIS — J040 Acute laryngitis: Secondary | ICD-10-CM

## 2024-01-30 MED ORDER — PREDNISONE 20 MG PO TABS
40.0000 mg | ORAL_TABLET | Freq: Every day | ORAL | 0 refills | Status: AC
Start: 2024-01-30 — End: ?

## 2024-01-30 NOTE — Progress Notes (Signed)
 We are sorry that you are not feeling well.  Here is how we plan to help!  Based on your presentation I believe you most likely have A cough due to a virus.  This is called viral bronchitis and is best treated by rest, plenty of fluids and control of the cough.  You may use Ibuprofen  or Tylenol  as directed to help your symptoms.     In addition you may use A non-prescription cough medication called Mucinex DM: take 2 tablets every 12 hours.  I have prescribed Prednisone  20mg  Take 2 tablets (40mg ) daily for 5 days.   From your responses in the eVisit questionnaire you describe inflammation in the upper respiratory tract which is causing a significant cough.  This is commonly called Bronchitis and has four common causes:   Allergies Viral Infections Acid Reflux Bacterial Infection Allergies, viruses and acid reflux are treated by controlling symptoms or eliminating the cause. An example might be a cough caused by taking certain blood pressure medications. You stop the cough by changing the medication. Another example might be a cough caused by acid reflux. Controlling the reflux helps control the cough.  USE OF BRONCHODILATOR (RESCUE) INHALERS: There is a risk from using your bronchodilator too frequently.  The risk is that over-reliance on a medication which only relaxes the muscles surrounding the breathing tubes can reduce the effectiveness of medications prescribed to reduce swelling and congestion of the tubes themselves.  Although you feel brief relief from the bronchodilator inhaler, your asthma may actually be worsening with the tubes becoming more swollen and filled with mucus.  This can delay other crucial treatments, such as oral steroid medications. If you need to use a bronchodilator inhaler daily, several times per day, you should discuss this with your provider.  There are probably better treatments that could be used to keep your asthma under control.     HOME CARE Only take  medications as instructed by your medical team. Complete the entire course of an antibiotic. Drink plenty of fluids and get plenty of rest. Avoid close contacts especially the very young and the elderly Cover your mouth if you cough or cough into your sleeve. Always remember to wash your hands A steam or ultrasonic humidifier can help congestion.   GET HELP RIGHT AWAY IF: You develop worsening fever. You become short of breath You cough up blood. Your symptoms persist after you have completed your treatment plan MAKE SURE YOU  Understand these instructions. Will watch your condition. Will get help right away if you are not doing well or get worse.  Your e-visit answers were reviewed by a board certified advanced clinical practitioner to complete your personal care plan.  Depending on the condition, your plan could have included both over the counter or prescription medications. If there is a problem please reply  once you have received a response from your provider. Your safety is important to us .  If you have drug allergies check your prescription carefully.    You can use MyChart to ask questions about today's visit, request a non-urgent call back, or ask for a work or school excuse for 24 hours related to this e-Visit. If it has been greater than 24 hours you will need to follow up with your provider, or enter a new e-Visit to address those concerns. You will get an e-mail in the next two days asking about your experience.  I hope that your e-visit has been valuable and will speed your recovery.  Thank you for using e-visits.   I have spent 5 minutes in review of e-visit questionnaire, review and updating patient chart, medical decision making and response to patient.   Delon CHRISTELLA Dickinson, PA-C

## 2024-04-12 IMAGING — MG DIGITAL DIAGNOSTIC BILAT W/ TOMO W/ CAD
6 of 10 series · 6 of 30 positions shown · non-contrast
Comparison: None.

CLINICAL DATA: 31-year-old female with a 3 month history of a
palpable area of concern in the left breast.



[L TAN synth-2D]
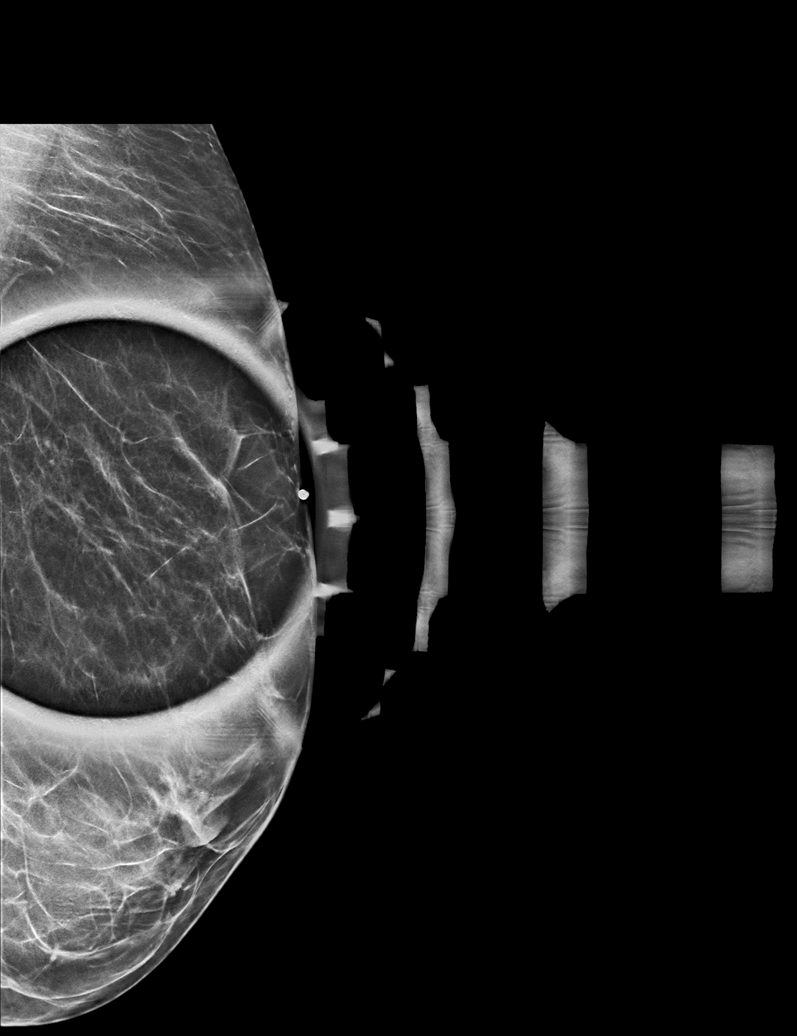

[L CC synth-2D]
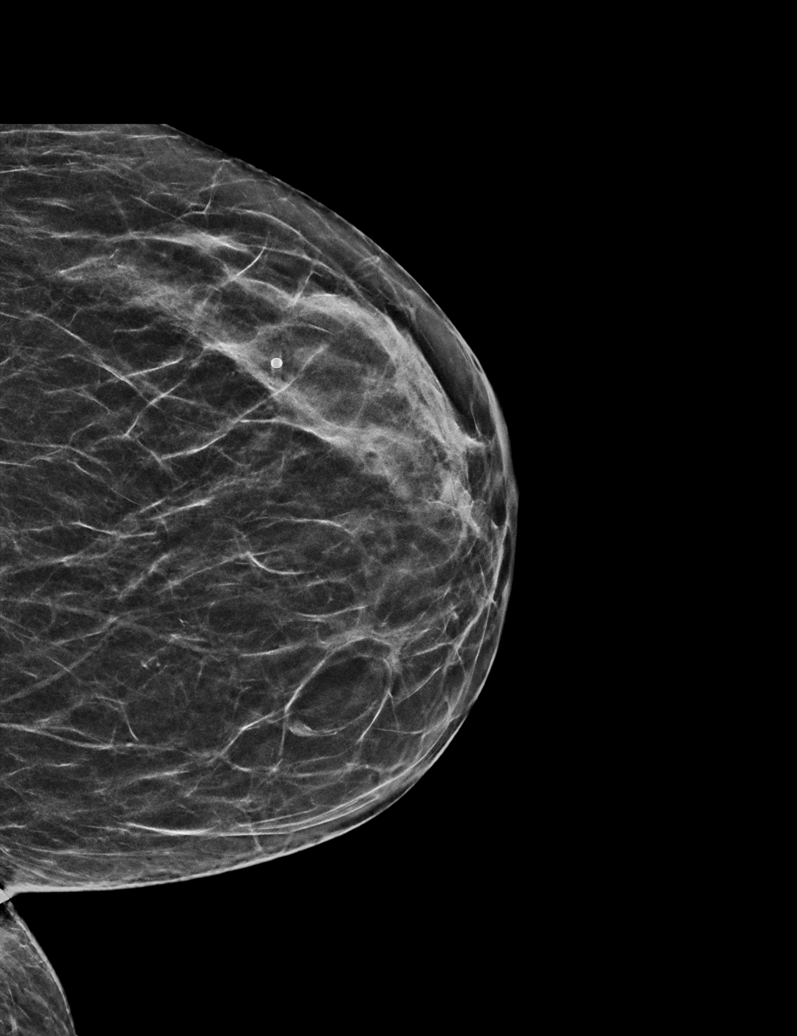

[R MLO synth-2D]
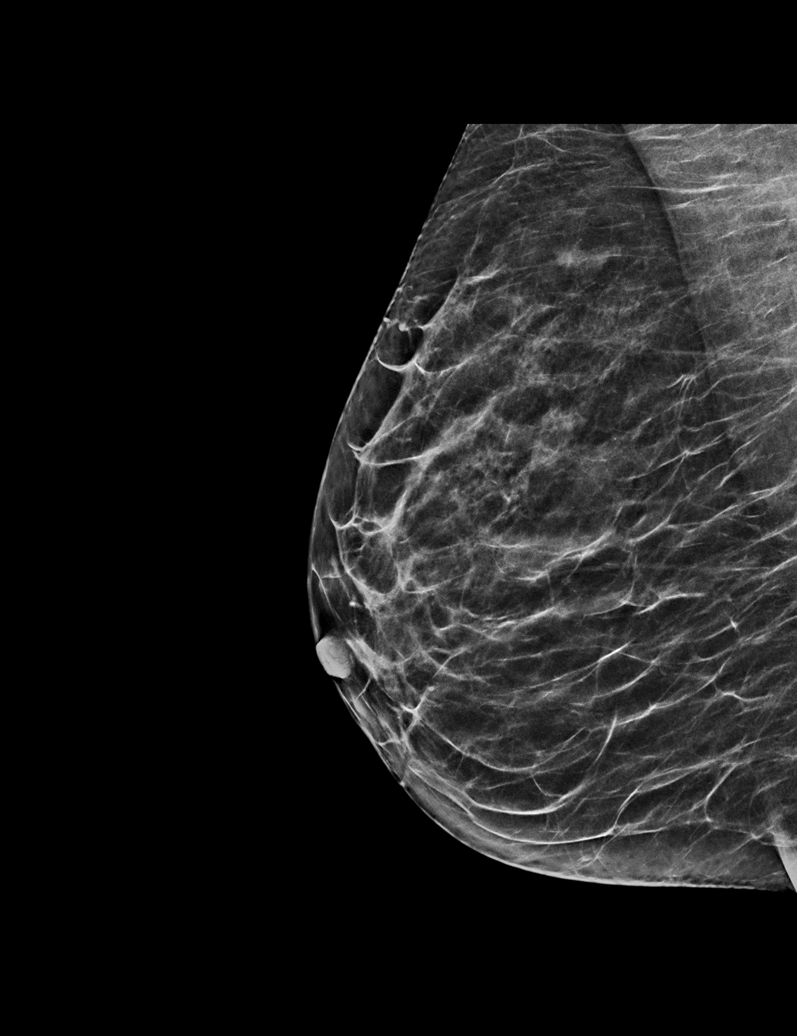

[R CC synth-2D]
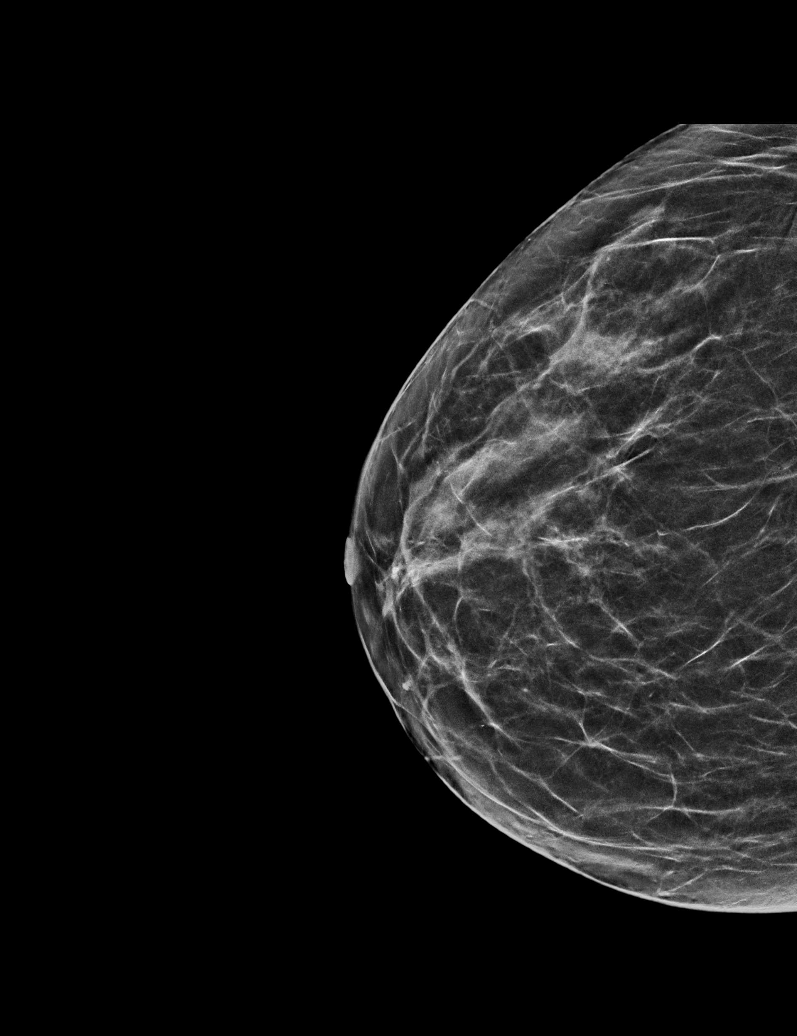

[L MLO synth-2D]
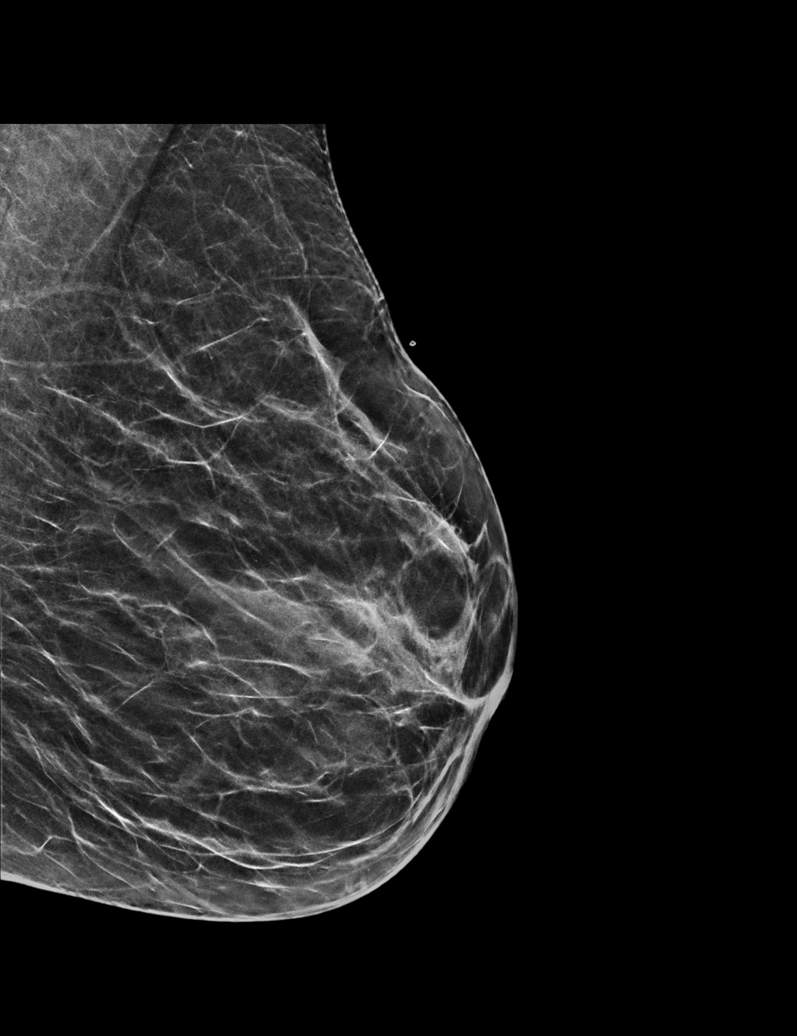

[L MLO tomo · tomo slice 25/50.0]
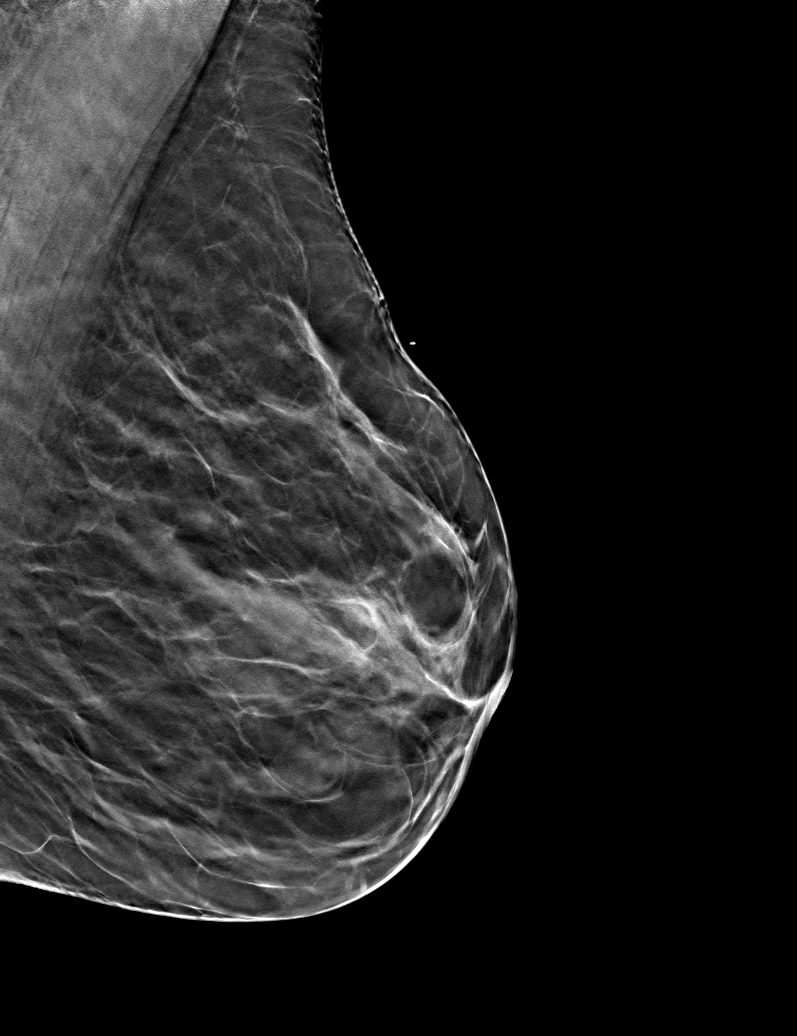

[6 of 30 positions shown; findings below may reference images not displayed]

ACR Breast Density Category b: There are scattered areas of
fibroglandular density.
FINDINGS: There is an oval mass in the upper-outer posterior right 0.7 cm with
central lucency suggestive of a low axillary. Spot compression
tomograms the palpable area concern in the left breast no definite
abnormality seen.

Physical examination at site of palpable concern in the upper-outer
left breast reveals generalized thickening without a discrete
underlying palpable mass.

Targeted ultrasound of the left breast was performed demonstrating a
dense fibroglandular tissue with a tiny cyst at the [DATE] position 3
cm from nipple measuring 0.3 x 0.2 x 0.3 cm. No suspicious masses or
abnormality seen in upper-outer left breast.

Targeted ultrasound of the right breast. No suspicious masses or
abnormality seen, only heterogeneous fibroglandular tissue
identified. The entire outer right breast was scanned.
IMPRESSION: 1. Dense fibroglandular tissue and a tiny cyst seen in upper-outer
left breast at site of palpable concern.

2.  Probably benign right breast mass.

RECOMMENDATION:
Recommend six-month follow-up mammography of the right breast.

I have discussed the findings and recommendations with the patient.
If applicable, a reminder letter will be sent to the patient
regarding the next appointment.

BI-RADS CATEGORY  3: Probably benign.

## 2024-05-20 ENCOUNTER — Ambulatory Visit: Admitting: Physician Assistant
